# Patient Record
Sex: Male | Born: 1975
Health system: Southern US, Community
[De-identification: ages and names within clinical notes are randomized; demographics above are authoritative.]

## PROBLEM LIST (undated history)

## (undated) DIAGNOSIS — N189 Chronic kidney disease, unspecified: Secondary | ICD-10-CM

## (undated) DIAGNOSIS — K219 Gastro-esophageal reflux disease without esophagitis: Secondary | ICD-10-CM

## (undated) HISTORY — PX: CATARACT EXTRACTION W/ INTRAOCULAR LENS IMPLANT: SHX1309

## (undated) HISTORY — DX: Chronic kidney disease, unspecified: N18.9

## (undated) HISTORY — DX: Gastro-esophageal reflux disease without esophagitis: K21.9

---

## 2005-04-26 ENCOUNTER — Ambulatory Visit: Payer: Self-pay | Admitting: Internal Medicine

## 2006-04-10 ENCOUNTER — Ambulatory Visit: Payer: Self-pay | Admitting: Internal Medicine

## 2006-04-11 ENCOUNTER — Encounter: Payer: Self-pay | Admitting: Internal Medicine

## 2007-04-04 ENCOUNTER — Encounter: Payer: Self-pay | Admitting: Family Medicine

## 2007-04-05 ENCOUNTER — Ambulatory Visit: Payer: Self-pay | Admitting: Family Medicine

## 2007-04-05 DIAGNOSIS — K921 Melena: Secondary | ICD-10-CM | POA: Insufficient documentation

## 2007-04-06 LAB — CONVERTED CEMR LAB
Basophils Relative: 0.5 % (ref 0.0–1.0)
Eosinophils Relative: 1.2 % (ref 0.0–5.0)
HCT: 44.1 % (ref 39.0–52.0)
Hemoglobin: 15.2 g/dL (ref 13.0–17.0)
Lymphocytes Relative: 22.8 % (ref 12.0–46.0)
Monocytes Absolute: 0.5 10*3/uL (ref 0.2–0.7)
Neutrophils Relative %: 66.9 % (ref 43.0–77.0)
RDW: 12.4 % (ref 11.5–14.6)

## 2008-06-11 ENCOUNTER — Ambulatory Visit: Payer: Self-pay | Admitting: Internal Medicine

## 2008-06-11 DIAGNOSIS — J301 Allergic rhinitis due to pollen: Secondary | ICD-10-CM | POA: Insufficient documentation

## 2008-06-11 LAB — CONVERTED CEMR LAB
Cholesterol: 179 mg/dL (ref 0–200)
Glucose, Bld: 83 mg/dL (ref 70–99)
HDL: 56.3 mg/dL (ref >39.0)
LDL Cholesterol: 116 mg/dL — ABNORMAL HIGH (ref 0–99)
Total CHOL/HDL Ratio: 3.2
Triglycerides: 32 mg/dL (ref 0–149)
VLDL: 6 mg/dL (ref 0–40)

## 2009-01-22 ENCOUNTER — Ambulatory Visit: Payer: Self-pay | Admitting: Internal Medicine

## 2009-01-22 DIAGNOSIS — R109 Unspecified abdominal pain: Secondary | ICD-10-CM | POA: Insufficient documentation

## 2009-01-27 LAB — CONVERTED CEMR LAB
ALT: 28 units/L (ref 0–53)
Amylase: 210 units/L — ABNORMAL HIGH (ref 27–131)
BUN: 16 mg/dL (ref 6–23)
Bilirubin, Direct: 0.1 mg/dL (ref 0.0–0.3)
CO2: 27 meq/L (ref 19–32)
Creatinine, Ser: 1.1 mg/dL (ref 0.4–1.5)
Eosinophils Relative: 1 % (ref 0.0–5.0)
Glucose, Bld: 87 mg/dL (ref 70–99)
MCV: 88.7 fL (ref 78.0–100.0)
Monocytes Absolute: 0.4 10*3/uL (ref 0.1–1.0)
Neutrophils Relative %: 60.9 % (ref 43.0–77.0)
Phosphorus: 3.8 mg/dL (ref 2.3–4.6)
Platelets: 169 10*3/uL (ref 150.0–400.0)
Total Bilirubin: 1 mg/dL (ref 0.3–1.2)
WBC: 5.9 10*3/uL (ref 4.5–10.5)

## 2009-08-24 ENCOUNTER — Telehealth: Payer: Self-pay | Admitting: Internal Medicine

## 2009-08-28 ENCOUNTER — Ambulatory Visit: Payer: Self-pay | Admitting: Internal Medicine

## 2009-08-28 DIAGNOSIS — K625 Hemorrhage of anus and rectum: Secondary | ICD-10-CM | POA: Insufficient documentation

## 2009-09-02 LAB — CONVERTED CEMR LAB
Eosinophils Relative: 0.9 % (ref 0.0–5.0)
HCT: 44.7 % (ref 39.0–52.0)
Hemoglobin: 15 g/dL (ref 13.0–17.0)
Lymphs Abs: 1.6 10*3/uL (ref 0.7–4.0)
Monocytes Relative: 6.7 % (ref 3.0–12.0)
Neutro Abs: 3.4 10*3/uL (ref 1.4–7.7)
RDW: 12.8 % (ref 11.5–14.6)
WBC: 5.6 10*3/uL (ref 4.5–10.5)

## 2009-09-07 ENCOUNTER — Telehealth: Payer: Self-pay | Admitting: Family Medicine

## 2009-09-08 ENCOUNTER — Encounter (INDEPENDENT_AMBULATORY_CARE_PROVIDER_SITE_OTHER): Payer: Self-pay | Admitting: *Deleted

## 2009-09-10 ENCOUNTER — Telehealth: Payer: Self-pay | Admitting: Internal Medicine

## 2009-10-05 ENCOUNTER — Ambulatory Visit: Payer: Self-pay | Admitting: Internal Medicine

## 2009-10-05 ENCOUNTER — Encounter (INDEPENDENT_AMBULATORY_CARE_PROVIDER_SITE_OTHER): Payer: Self-pay | Admitting: *Deleted

## 2009-10-05 DIAGNOSIS — R198 Other specified symptoms and signs involving the digestive system and abdomen: Secondary | ICD-10-CM | POA: Insufficient documentation

## 2010-07-27 NOTE — Progress Notes (Signed)
Summary: bloody stools, stomach pain/ alc   Phone Note Call from Patient Call back at [phone]   Caller: Spouse Call For: Dr. Alphonsus Sias  Action Taken: Phone Call Completed Summary of Call: Wife would like phone call from you ragarding husband having bloody stools. She says that he was seen once for this before and is still having the same problems. She says that it is painful when he uses the bathroom and he also has stomach pains some times. I scheduled him an app to come in of friday. They use [facility] [address] if needed.  Initial call taken by: Melody Comas,  August 24, 2009 9:30 AM  Follow-up for Phone Call        please add him on today Follow-up by: Cindee Salt MD,  August 24, 2009 10:34 AM  Additional Follow-up for Phone Call Additional follow up Details #1::        cell number listed is incorrect, left message on machine at home for patient to return call.  DeShannon Smith CMA Duncan Dull)  August 24, 2009 10:38 AM   Spoke with pt's wife, she says pt has not had bloody stools since october- except for once  yesterday and he only has pain about once a week, and then it's very mild. Her main concern is whether he could have hemorrhoids again.  I told her he would need exam to know for sure.  She just wants to keep his friday appt. (cell phone number corrected) Additional Follow-up by: Lowella Petties CMA,  August 24, 2009 10:52 AM    Additional Follow-up for Phone Call Additional follow up Details #2::    okay as long as no sig pain and no active bleeding Follow-up by: Cindee Salt MD,  August 24, 2009 11:57 AM

## 2010-07-27 NOTE — Progress Notes (Signed)
Summary: wants to go to [facility]  Phone Note Call from Patient Call back at [facility] Phone [phone] Call back at [phone]   Caller: Spouse-  Jillian Summary of Call: Pt was referred to [facility] but wife wants to change that to [place]. Wants to see a male doctor. Needs to be a monday or an early morning appt (8-8:30).  Wife will cancel appt at [facility].   Thanks. Initial call taken by: Lowella Petties CMA,  September 10, 2009 2:39 PM  Follow-up for Phone Call        Spoke to the wife, for now they will keep the appt with [place]. Follow-up by: Carlton Adam,  September 10, 2009 3:51 PM

## 2010-07-27 NOTE — Progress Notes (Signed)
Summary: still with bloody stools  Phone Note Call from Patient Call back at [facility] Phone [phone] Call back at [phone]   Caller: Spouse- Jillian Summary of Call: Advised pt's wife of lab results.  She says every time the pt has had a BM over the last week it has been bloody- some on end of stool and on toilet paper.  Not in the toilet.  Please advise. Initial call taken by: Lowella Petties CMA,  September 07, 2009 2:23 PM  Follow-up for Phone Call        we should set up evaluation by GI to be sure he doesn't have colitis or proctitis Follow-up by: Cindee Salt MD,  September 07, 2009 2:56 PM  Additional Follow-up for Phone Call Additional follow up Details #1::        Appt made with Dr Leone Payor on 10/06/2009 at 9:45am. Additional Follow-up by: Carlton Adam,  September 08, 2009 5:09 PM

## 2010-07-27 NOTE — Assessment & Plan Note (Signed)
Summary: blood iin stools.Marland Kitchenem   History of Present Illness Visit Type: consult  Primary GI MD: Stan Head MD [facility] Primary Provider: Tillman Abide, MD Requesting Provider: Tillman Abide, MD Chief Complaint: rectal bleeding and rectal pain  History of Present Illness:   35 yo [place] man.He has had rectal bleeding off and on for two years. Initially on the toilet paper. Bowels irregular with some narrow stools at times. He is better since using  Much les bleeding - non since then. Last time3 weeks ago after "heavy" fod and some seen on toilet paper.   GI Review of Systems    Reports abdominal pain and  bloating.     Location of  Abdominal pain: generalized.    Denies acid reflux, belching, chest pain, dysphagia with liquids, dysphagia with solids, heartburn, loss of appetite, nausea, vomiting, vomiting blood, weight loss, and  weight gain.      Reports rectal bleeding and  rectal pain.     Denies anal fissure, black tarry stools, change in bowel habit, constipation, diarrhea, diverticulosis, fecal incontinence, heme positive stool, hemorrhoids, irritable bowel syndrome, jaundice, light color stool, and  liver problems.    Current Medications (verified): 1)  None  Allergies (verified): No Known Drug Allergies  Past History:  Past Medical History: Reviewed history from 06/11/2008 and no changes required. Allergic rhinitis  Past Surgical History: Reviewed history from 06/11/2008 and no changes required. Denies surgical history  Family History: Reviewed history from 08/28/2009 and no changes required. Parents in good health Dad has high cholesterol 1 brother  Maternal GF died ESRD No CAD, HTN, DM No prostate or colon cancer No Crohn's or colitis  Social History: Marital Status: Married 1 daughter Occupation: Tax adviser) - [employer] Former Smoker Alcohol use-yes, occasional Drug use-no Regular exercise-not really Daily Caffeine Use: 3 daily   Review of  Systems       some anxiety All other ROS negative except as per HPI.   Vital Signs:  Patient profile:   35 year old male Height:      67.5 inches Weight:      158 pounds BMI:     24.47 BSA:     1.84 Pulse rate:   64 / minute Pulse rhythm:   regular BP sitting:   128 / 76  (left arm) Cuff size:   regular  Vitals Entered By: Ok Anis CMA (October 05, 2009 10:16 AM)  Physical Exam  General:  Well developed, well nourished, no acute distress. Eyes:  PERRLA, no icterus. Mouth:  No deformity or lesions, dentition with multiple fillins. Neck:  Supple; no masses or thyromegaly. Lungs:  Clear throughout to auscultation. Heart:  Regular rate and rhythm; no murmurs, rubs,  or bruits. Abdomen:  Soft, nontender and nondistended. No masses, hepatosplenomegaly or hernias noted. Normal bowel sounds. Rectal:  deferred until time of colonoscopy.  Dr. Karle Starch 3/4 exam reviewed Extremities:  No clubbing, cyanosis, edema or deformities noted. Skin:  tattoos, upper back Cervical Nodes:  No significant cervical or supraclavicular adenopathy.    Impression & Recommendations:  Problem # 1:  RECTAL BLEEDING (ICD-569.3)  NEW to GI: Seems likely ano-rectal but with some bowel habit changes would evaluate for colo-rectal lesions. Risks, benefits,and indications of endoscopic procedure(s) were reviewed with the patient and all questions answered. He wishes to wait til he goes to [place] in May and do it after that  Orders: Colonoscopy (Colon)  Problem # 2:  CHANGE IN BOWELS (BJY-782.95) Assessment: New  better on  MiraLax plan to evaluate with colonoscopy iven the bleeding.  Orders: Colonoscopy (Colon)  Patient Instructions: 1)  Please pick up your medications at your pharmacy. MOVIPREP 2)  We will see you at your procedure on 12/01/09. 3)  [facility] Patient Information Guide given to patient.  4)  Colonoscopy and Flexible Sigmoidoscopy brochure given.  5)  Copy sent to :  Tillman Abide, MD 6)  The medication list was reviewed and reconciled.  All changed / newly prescribed medications were explained.  A complete medication list was provided to the patient / caregiver. Prescriptions: MOVIPREP 100 GM  SOLR (PEG-KCL-NACL-NASULF-NA ASC-C) As per prep instructions.  #1 x 0   Entered by:   Francee Piccolo CMA (AAMA)   Authorized by:   Iva Boop MD, [facility]   Signed by:   Francee Piccolo CMA (AAMA) on 10/05/2009   Method used:   Print then Give to Patient   RxID:   [id number]

## 2010-07-27 NOTE — Letter (Signed)
Summary: [facility] Instructions  [facility]  [address] [place], [place] [address]   Phone: [phone]  Fax: [phone]       Thomas Villarreal    Nov 10, 1975    MRN: [id number]      Procedure Day Dorna Bloom: [name], 12/01/09     Arrival Time: 7:30 AM      Procedure Time: 8:30 AM    Location of Procedure:                    _X_   Endoscopy Center (4th Floor)                     PREPARATION FOR COLONOSCOPY WITH MOVIPREP   Starting 5 days prior to your procedure 11/26/09 do not eat nuts, seeds, popcorn, corn, beans, peas,  salads, or any raw vegetables.  Do not take any fiber supplements (e.g. Metamucil, Citrucel, and Benefiber).  THE DAY BEFORE YOUR PROCEDURE         MONDAY, 11/30/09  1.  Drink clear liquids the entire day-NO SOLID FOOD  2.  Do not drink anything colored red or purple.  Avoid juices with pulp.  No orange juice.  3.  Drink at least 64 oz. (8 glasses) of fluid/clear liquids during the day to prevent dehydration and help the prep work efficiently.  CLEAR LIQUIDS INCLUDE: Water Jello Ice Popsicles Tea (sugar ok, no milk/cream) Powdered fruit flavored drinks Coffee (sugar ok, no milk/cream) Gatorade Juice: apple, white grape, white cranberry  Lemonade Clear bullion, consomm, broth Carbonated beverages (any kind) Strained chicken noodle soup Hard Candy                             4.  In the morning, mix first dose of MoviPrep solution:    Empty 1 Pouch A and 1 Pouch B into the disposable container    Add lukewarm drinking water to the top line of the container. Mix to dissolve    Refrigerate (mixed solution should be used within 24 hrs)  5.  Begin drinking the prep at 5:00 p.m. The MoviPrep container is divided by 4 marks.   Every 15 minutes drink the solution down to the next mark (approximately 8 oz) until the full liter is complete.   6.  Follow completed prep with 16 oz of clear liquid of your choice (Nothing red or purple).   Continue to drink clear liquids until bedtime.    7.  Before going to bed, mix second dose of MoviPrep solution:    Empty 1 Pouch A and 1 Pouch B into the disposable container    Add lukewarm drinking water to the top line of the container. Mix to dissolve    Refrigerate  THE DAY OF YOUR PROCEDURE      TUESDAY, 12/01/09  Beginning at 3:30 a.m. (5 hours before procedure):         1. Every 15 minutes, drink the solution down to the next mark (approx 8 oz) until the full liter is complete.  2. Follow completed prep with 16 oz. of clear liquid of your choice.    3. You may drink clear liquids until 6:30 AM (2 HOURS BEFORE PROCEDURE).  MEDICATION INSTRUCTIONS  Unless otherwise instructed, you should take regular prescription medications with a small sip of water   as early as possible the morning of your procedure.       OTHER INSTRUCTIONS  You will need a responsible adult at least 36 years of age to accompany you and drive you home.   This person must remain in the waiting room during your procedure.  Wear loose fitting clothing that is easily removed.  Leave jewelry and other valuables at home.  However, you may wish to bring a book to read or  an iPod/MP3 player to listen to music as you wait for your procedure to start.  Remove all body piercing jewelry and leave at home.  Total time from sign-in until discharge is approximately 2-3 hours.  You should go home directly after your procedure and rest.  You can resume normal activities the  day after your procedure.  The day of your procedure you should not:   Drive   Make legal decisions   Operate machinery   Drink alcohol   Return to work  You will receive specific instructions about eating, activities and medications before you leave.  The above instructions have been reviewed and explained to me by   [facility], CMA   I fully understand and can verbalize these instructions _____________________________  Date _________

## 2010-07-27 NOTE — Letter (Signed)
Summary: New Patient letter  [facility]  [address] [place], [place] [address]   Phone: [phone]  Fax: [phone]       09/08/2009 MRN: [id number]  [facility] [address] [place], [place]  [address]  Dear Mr. KLOSTER,  Welcome to the Gastroenterology Division at [facility].    You are scheduled to see Dr.  Stan Head on October 05, 2009 at 10:00am on the 3rd floor at [facility], [address]  We ask that you try to arrive at our office 15 minutes prior to your appointment time to allow for check-in.  We would like you to complete the enclosed self-administered evaluation form prior to your visit and bring it with you on the day of your appointment.  We will review it with you.  Also, please bring a complete list of all your medications or, if you prefer, bring the medication bottles and we will list them.  Please bring your insurance card so that we may make a copy of it.  If your insurance requires a referral to see a specialist, please bring your referral form from your primary care physician.  Co-payments are due at the time of your visit and may be paid by cash, check or credit card.     Your office visit will consist of a consult with your physician (includes a physical exam), any laboratory testing he/she may order, scheduling of any necessary diagnostic testing (e.g. x-ray, ultrasound, CT-scan), and scheduling of a procedure (e.g. Endoscopy, Colonoscopy) if required.  Please allow enough time on your schedule to allow for any/all of these possibilities.    If you cannot keep your appointment, please call [phone] to cancel or reschedule prior to your appointment date.  This allows [place] the opportunity to schedule an appointment for another patient in need of care.  If you do not cancel or reschedule by 5 p.m. the business day prior to your appointment date, you will be charged a $50.00 late cancellation/no-show fee.    Thank you for  choosing [facility] for your medical needs.  We appreciate the opportunity to care for you.  Please visit [place] at our website  to learn more about our practice.                     Sincerely,                                                             The Gastroenterology Division

## 2010-07-27 NOTE — Assessment & Plan Note (Signed)
Summary: BLOOD STOOLS   Vital Signs:  Patient profile:   35 year old male Weight:      161 pounds Temp:     98 degrees F oral BP sitting:   102 / 68  (left arm) Cuff size:   large  Vitals Entered By: Mervin Hack CMA Duncan Dull) (August 28, 2009 8:20 AM) CC: blood in stool   History of Present Illness: Having stomach pain One episode of  pain when ready to move bowels--down in lower abdomen  Has occ blood on toiilet paper and in bowl Very intermittent  Doesn't feel that his bowels are moving regularly small stools intermittently  May go as long as 2-3 days has to push hard at times--may be related to bleeding episodes  trying a healthy diet with brown rice and vegetables No help in bowels trying to lose weight  Still has occ stomach pain like before No meds for this  Has dry throat at night and in AM occ bad breath ??PND no history of sig allergies in past  Allergies: No Known Drug Allergies  Past History:  Past medical, surgical, family and social histories (including risk factors) reviewed for relevance to current acute and chronic problems.  Past Medical History: Reviewed history from 06/11/2008 and no changes required. Allergic rhinitis  Past Surgical History: Reviewed history from 06/11/2008 and no changes required. Denies surgical history  Family History: Parents in good health Dad has high cholesterol 1 brother  Maternal GF died ESRD No CAD, HTN, DM No prostate or colon cancer No Crohn's or colitis  Social History: Reviewed history from 06/11/2008 and no changes required. Former Smoker Alcohol use-yes, occasional Drug use-no Regular exercise-not really Marital Status: Married 1 daughter Occupation: Therapist, sports - [employer]  Review of Systems       No fevers weight is stable  Physical Exam  General:  alert and normal appearance.   Mouth:  no erythema and no exudates.   Abdomen:  soft, non-tender, no masses, no hepatomegaly, and no  splenomegaly.   Rectal:  no hemorrhoids, no masses, no tenderness, no fissures, no fistulae, and no perianal rash.   Stool heme negative Prostate:  no gland enlargement and no nodules.     Impression & Recommendations:  Problem # 1:  RECTAL BLEEDING (ICD-569.3) Assessment New  seems to be local source though no hemorrhoids or perirectal lesions the pain, esp with moving bowels are concerning still has poor eating habits Bowels are irregular and may be adding to this  possiblity of colitis exists though exam is benign now  P: wiil check labs    start miralax to get bowels regular      Orders: Venipuncture ([address]) TLB-CBC Platelet - w/Differential (85025-CBCD) TLB-CRP-High Sensitivity (C-Reactive Protein) (86140-FCRP) TLB-Sedimentation Rate (ESR) (85652-ESR)  Patient Instructions: 1)  Please start miralax-- 1 capful daily or every other day to help your bowels become more regular 2)  Please schedule a follow-up appointment as needed .   Prior Medications: Current Allergies (reviewed today): No known allergies

## 2011-02-09 ENCOUNTER — Encounter: Payer: Self-pay | Admitting: Family Medicine

## 2011-02-10 ENCOUNTER — Encounter: Payer: Self-pay | Admitting: Family Medicine

## 2011-02-10 ENCOUNTER — Ambulatory Visit (INDEPENDENT_AMBULATORY_CARE_PROVIDER_SITE_OTHER): Payer: BC Managed Care – PPO | Admitting: Family Medicine

## 2011-02-10 VITALS — BP 110/60 | HR 60 | Temp 98.3°F | Wt 164.8 lb

## 2011-02-10 DIAGNOSIS — B078 Other viral warts: Secondary | ICD-10-CM | POA: Insufficient documentation

## 2011-02-10 DIAGNOSIS — B079 Viral wart, unspecified: Secondary | ICD-10-CM | POA: Insufficient documentation

## 2011-02-10 MED ORDER — SALICYLIC ACID 17 % EX GEL: CUTANEOUS | Status: DC

## 2011-02-10 NOTE — Assessment & Plan Note (Signed)
Close to naibed so try salicylic acid first. See pt instructions. If not improved, return for further eval, consider LN2.

## 2011-02-10 NOTE — Patient Instructions (Signed)
Use salicylic acid on wart (Duoplant, Compound W, Duofilm). Pumice stone to file away dead skin, soak thumb in warm water for 5 min, then apply salicylic acid topically.  Cover area with duct tape and keep covered for 3-4 days. If pain/irritation, take break for a few days. If not resolving after 1 month, return to be seen.  Warts (Verrucae Vulgaris) Warts are a common viral infection. They are most commonly caused by the human papillomavirus (HPV). Warts can occur at all ages. However, they occur most frequently in older children and infrequently in the elderly. Warts may be single or multiple. Location and size varies. Warts can be spread by scratching the wart and then scratching normal skin. The life cycle of warts varies. However, most will disappear over many months to a couple years. Warts commonly do not cause problems (asymptomatic) unless they are over an area of pressure, such as the bottom of the foot. If they are large enough, they may cause pain with walking. DIAGNOSIS Warts are most commonly diagnosed by their appearance. Tissue samples (biopsies) are not required unless the wart looks abnormal. Most warts have a rough surface, are round, oval, or irregular, and are skin-colored to light yellow, brown, or gray. They are generally less than  in. (1.3 cm), but they can be any size. TREATMENT  Observation or no treatment.  Freezing with liquid nitrogen.   High heat (cautery).   Boosting the body's immunity to fight off the wart (immunotherapy using Candida antigen).  Laser surgery.   Application of various irritants and solutions.   HOME CARE INSTRUCTIONS Follow your caregiver's instructions. No special precautions are necessary. Often, treatment may be followed by a return (recurrence) of warts. Warts are generally difficult to treat and get rid of. If treatment is done in a clinic setting, usually more than 1 treatment is required. This is usually done on only a monthly basis  until the wart is completely gone. Document Released: 03/23/2005 Document Re-Released: 12/01/2009 [facility] Patient Information 2011 [place], [place].

## 2011-02-10 NOTE — Progress Notes (Signed)
  Subjective:    Patient ID: Thomas Villarreal, male    DOB: 03/31/1976, 35 y.o.   MRN: [id number]  HPI CC: R lesion on thumb  67mo h/o ?wart R thumb.  No pain or draining but works in [employer].  Would like taken care of.  Seems to be enlarging.  Has not tried anything for this yet.  No h/o warts in past.  Review of Systems Per HPI    Objective:   Physical Exam  AFVSS R thumb medial to nailbed with exophytic growth, about 5mm in diameter, nontender, nonpruritic, dry      Assessment & Plan:

## 2011-02-14 ENCOUNTER — Telehealth: Payer: Self-pay | Admitting: *Deleted

## 2011-02-14 ENCOUNTER — Ambulatory Visit: Payer: BC Managed Care – PPO | Admitting: Family Medicine

## 2011-02-15 ENCOUNTER — Ambulatory Visit (INDEPENDENT_AMBULATORY_CARE_PROVIDER_SITE_OTHER): Payer: BC Managed Care – PPO | Admitting: Family Medicine

## 2011-02-15 ENCOUNTER — Encounter: Payer: Self-pay | Admitting: Family Medicine

## 2011-02-15 VITALS — BP 102/60 | HR 52 | Temp 98.0°F | Wt 163.8 lb

## 2011-02-15 DIAGNOSIS — B079 Viral wart, unspecified: Secondary | ICD-10-CM

## 2011-02-15 DIAGNOSIS — B078 Other viral warts: Secondary | ICD-10-CM

## 2011-02-15 NOTE — Progress Notes (Signed)
  Subjective:    Patient ID: Thomas Villarreal, male    DOB: 06-15-1976, 35 y.o.   MRN: [id number]  HPI  47mo h/o ?wart R thumb. Had been there for at least two months, growing in size. Saw Dr. Reece Agar on 8/16.  Due to close proximity nail bed, tried topical salicytic acid first. Wart "fell off" and is now an open draining wound.  He is concerned that it is no longer a wart. Not painful.  Drainage was clear.   No fevers, chills or surrounding erythema.  O BP 102/60  Pulse 52  Temp(Src) 98 F (36.7 C) (Oral)  Wt 163 lb 12 oz (74.277 kg)   R thumb medial to nailbed open superficial wound about 5mm in diameter, nontender, nonpruritic, not actively draining, no surrounding erythema.  A: ?Viral wart, resolved, now with open wound.  Does not appear infected at this point.  P: Apply topical antibiotic ointment (samples given) daily. 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 If it is not improving or showing signs of infection, pt to call us immediately. The patient indicates understanding of these issues and agrees with the plan.

## 2011-03-04 NOTE — Telephone Encounter (Signed)
Opened in error

## 2011-08-26 ENCOUNTER — Ambulatory Visit: Payer: BC Managed Care – PPO | Admitting: Internal Medicine

## 2011-10-21 ENCOUNTER — Encounter: Payer: Self-pay | Admitting: Internal Medicine

## 2011-10-21 ENCOUNTER — Ambulatory Visit (INDEPENDENT_AMBULATORY_CARE_PROVIDER_SITE_OTHER): Payer: BC Managed Care – PPO | Admitting: Internal Medicine

## 2011-10-21 VITALS — BP 104/66 | HR 68 | Temp 98.5°F | Ht 66.0 in | Wt 164.5 lb

## 2011-10-21 DIAGNOSIS — Z0000 Encounter for general adult medical examination without abnormal findings: Secondary | ICD-10-CM

## 2011-10-21 DIAGNOSIS — R109 Unspecified abdominal pain: Secondary | ICD-10-CM

## 2011-10-21 DIAGNOSIS — Z23 Encounter for immunization: Secondary | ICD-10-CM

## 2011-10-21 DIAGNOSIS — J309 Allergic rhinitis, unspecified: Secondary | ICD-10-CM

## 2011-10-21 LAB — TSH: TSH: 2.22 u[IU]/mL (ref 0.35–5.50)

## 2011-10-21 LAB — BASIC METABOLIC PANEL
Calcium: 9.4 mg/dL (ref 8.4–10.5)
Creatinine, Ser: 1.1 mg/dL (ref 0.4–1.5)

## 2011-10-21 LAB — CBC WITH DIFFERENTIAL/PLATELET
Basophils Relative: 1 % (ref 0.0–3.0)
Eosinophils Absolute: 0 10*3/uL (ref 0.0–0.7)
Eosinophils Relative: 0.1 % (ref 0.0–5.0)
Lymphocytes Relative: 20.1 % (ref 12.0–46.0)
MCHC: 33.8 g/dL (ref 30.0–36.0)
Neutrophils Relative %: 72 % (ref 43.0–77.0)
RBC: 4.96 Mil/uL (ref 4.22–5.81)
WBC: 6.2 10*3/uL (ref 4.5–10.5)

## 2011-10-21 LAB — LIPID PANEL
HDL: 71.6 mg/dL (ref >39.00)
LDL Cholesterol: 105 mg/dL — ABNORMAL HIGH (ref 0–99)
Total CHOL/HDL Ratio: 3
Triglycerides: 30 mg/dL (ref 0.0–149.0)
VLDL: 6 mg/dL (ref 0.0–40.0)

## 2011-10-21 LAB — HEPATIC FUNCTION PANEL
ALT: 68 U/L — ABNORMAL HIGH (ref 0–53)
Albumin: 4.1 g/dL (ref 3.5–5.2)
Bilirubin, Direct: 0 mg/dL (ref 0.0–0.3)
Total Protein: 7.1 g/dL (ref 6.0–8.3)

## 2011-10-21 NOTE — Progress Notes (Signed)
Addended by: Josph Macho A on: 10/21/2011 12:48 PM   Modules accepted: Orders

## 2011-10-21 NOTE — Assessment & Plan Note (Signed)
Healthy Working out regularly Will update Td (with Tdap)

## 2011-10-21 NOTE — Assessment & Plan Note (Signed)
Discussed OTC Rx 

## 2011-10-21 NOTE — Assessment & Plan Note (Signed)
This is better No current symptoms to consider IBD Never had scopes done---no need to reconsider at this point Will recheck labs

## 2011-10-21 NOTE — Patient Instructions (Signed)
You can try cetirizine 10mg  daily or loratadine 10mg  1-2 daily as needed for allergy symptoms

## 2011-10-21 NOTE — Progress Notes (Signed)
Subjective:    Patient ID: Thomas Villarreal, male    DOB: March 15, 1976, 36 y.o.   MRN: [id number]  HPI Here for physical  No stomach problems No rectal bleeding Has been working out with trainer for some months---feels better Concerned about instructions to eat eggs before workouts and protein shakes after--worries about his cholesterol  Business is good Wife now able to help since child in school  Has treated wart on right thumb at home Now just has pink area there--?neovascularization  Concerned about thyroid Daughter had abnormal newborn testing--came back positive Mild abnormality persisted but no Rx needed  Current Outpatient Prescriptions on File Prior to Visit  Medication Sig Dispense Refill  . salicylic acid 17 % gel use as directed  15 g  0    No Known Allergies  Past Medical History  Diagnosis Date  . Allergic rhinitis     No past surgical history on file.  Family History  Problem Relation Age of Onset  . Hyperlipidemia Father   . Healthy Mother   . Kidney disease Maternal Grandfather     History   Social History  . Marital Status: Married    Spouse Name: N/A    Number of Children: 1  . Years of Education: N/A   Occupational History  . Pizza Maker/Manager Other    [employer]   Social History Main Topics  . Smoking status: Former Games developer  . Smokeless tobacco: Not on file  . Alcohol Use: Yes     Occasional  . Drug Use: No  . Sexually Active: Not on file   Other Topics Concern  . Not on file   Social History Narrative   Married; 1 daughterPizza maker/manager-Ciao'sNo regular exerciseCaffeine: 3/day   Review of Systems  Constitutional: Negative for fatigue and unexpected weight change.       Wears seat belt  HENT: Positive for congestion and rhinorrhea. Negative for hearing loss, dental problem and tinnitus.        Did see ENT for some allergy symptoms--hasn't used meds though (they recommended testing) Regular with dentist Diagnosed with  deviated septum also  Eyes: Negative for visual disturbance.       No unilateral vision loss or diplopia  Respiratory: Negative for cough, chest tightness and shortness of breath.   Cardiovascular: Negative for chest pain, palpitations and leg swelling.  Gastrointestinal: Negative for nausea, vomiting, abdominal pain, constipation and blood in stool.  Genitourinary: Negative for dysuria, frequency and difficulty urinating.       No sexual problems  Musculoskeletal: Negative for back pain, joint swelling and arthralgias.  Skin: Negative for pallor and rash.       Gets dry skin  Neurological: Negative for dizziness, syncope, weakness, light-headedness, numbness and headaches.  Hematological: Negative for adenopathy. Does not bruise/bleed easily.  Psychiatric/Behavioral: Negative for sleep disturbance and dysphoric mood. The patient is not nervous/anxious.        Objective:   Physical Exam  Constitutional: He is oriented to person, place, and time. He appears well-developed and well-nourished. No distress.  HENT:  Head: Normocephalic and atraumatic.  Right Ear: External ear normal.  Left Ear: External ear normal.  Mouth/Throat: Oropharynx is clear and moist. No oropharyngeal exudate.  Eyes: Conjunctivae and EOM are normal. Pupils are equal, round, and reactive to light.  Neck: Normal range of motion. Neck supple. No thyromegaly present.  Cardiovascular: Normal rate, regular rhythm, normal heart sounds and intact distal pulses.  Exam reveals no gallop.   No murmur heard.  Pulmonary/Chest: Effort normal and breath sounds normal. No respiratory distress. He has no wheezes. He has no rales.  Abdominal: Soft. There is no tenderness.  Musculoskeletal: Normal range of motion. He exhibits no edema and no tenderness.  Lymphadenopathy:    He has no cervical adenopathy.  Neurological: He is alert and oriented to person, place, and time.  Skin: No rash noted. No erythema.  Psychiatric: He has a  normal mood and affect. His behavior is normal. Thought content normal.          Assessment & Plan:

## 2011-10-27 ENCOUNTER — Encounter: Payer: Self-pay | Admitting: *Deleted

## 2012-05-25 ENCOUNTER — Encounter: Payer: Self-pay | Admitting: Family Medicine

## 2012-05-25 ENCOUNTER — Ambulatory Visit (INDEPENDENT_AMBULATORY_CARE_PROVIDER_SITE_OTHER): Payer: BC Managed Care – PPO | Admitting: Family Medicine

## 2012-05-25 VITALS — BP 118/80 | HR 66 | Temp 98.4°F | Ht 66.0 in | Wt 176.2 lb

## 2012-05-25 DIAGNOSIS — B078 Other viral warts: Secondary | ICD-10-CM

## 2012-05-25 DIAGNOSIS — R7989 Other specified abnormal findings of blood chemistry: Secondary | ICD-10-CM

## 2012-05-25 DIAGNOSIS — M545 Low back pain, unspecified: Secondary | ICD-10-CM

## 2012-05-25 DIAGNOSIS — B079 Viral wart, unspecified: Secondary | ICD-10-CM

## 2012-05-25 LAB — HEPATIC FUNCTION PANEL
Albumin: 4.2 g/dL (ref 3.5–5.2)
Alkaline Phosphatase: 53 U/L (ref 39–117)
Bilirubin, Direct: 0.1 mg/dL (ref 0.0–0.3)
Total Protein: 7.6 g/dL (ref 6.0–8.3)

## 2012-05-25 MED ORDER — CYCLOBENZAPRINE HCL 10 MG PO TABS: 10.0000 mg | ORAL_TABLET | Freq: Every evening | ORAL | Status: DC | PRN

## 2012-05-25 MED ORDER — DICLOFENAC SODIUM 75 MG PO TBEC: 75.0000 mg | DELAYED_RELEASE_TABLET | Freq: Two times a day (BID) | ORAL | Status: DC

## 2012-05-25 NOTE — Assessment & Plan Note (Signed)
Some evidence of radiculopathy, also muscle spasm. Treat with NSAIDs, muscle relaxant. Info given. Follow up if not improving in 2 weeks.

## 2012-05-25 NOTE — Progress Notes (Signed)
  Subjective:    Patient ID: Thomas Villarreal, male    DOB: 1976-03-02, 36 y.o.   MRN: [id number]  HPI 36 year old male presents with new onset low back pain. Onset in last 24 hours.  He did some deadweight lifting at the gym the day prior, no specific injury but had started back after not exercising. No fall.  He has applied heat, taking excederine. These have helped a lot but sleep is disturbed. Pain radiates to right buttock an right posterior upper thigh. No weakness, no numbness. No fever.  No history of back issues or past back surgery.   Has had wart on right thumb.Marland Kitchen Has applied salicylic acid. Helps some but always comes back.   Review of Systems  Constitutional: Negative for fatigue.  HENT: Negative for ear pain.   Eyes: Negative for pain.  Respiratory: Negative for cough and shortness of breath.   Cardiovascular: Negative for chest pain.  Skin: Negative for rash.       Objective:   Physical Exam  Constitutional: He is oriented to person, place, and time. He appears well-developed and well-nourished.  Neck: Normal range of motion. Neck supple. No thyromegaly present.  Cardiovascular: Normal rate and regular rhythm.   No murmur heard. Pulmonary/Chest: Effort normal and breath sounds normal.  Abdominal: Soft. Bowel sounds are normal. There is no tenderness.  Musculoskeletal:       ttp over B low back paraspinous muscles, right buttock ttp, positive SLR on right, neg Faber's.  Strength 5/5 B. Sensation intact.  Neurological: He is alert and oriented to person, place, and time. He has normal reflexes.  Skin: Skin is warm and dry. No rash noted.       Wart on right thumb lateral edge of nail bed.          Assessment & Plan:

## 2012-05-25 NOTE — Addendum Note (Signed)
Addended by: Baldomero Lamy on: 05/25/2012 09:07 AM   Modules accepted: Orders

## 2012-05-25 NOTE — Patient Instructions (Signed)
Start anti-inflammatory.  Use muscle relaxant at nght.  Heat, massage, gentle stretching.  Follow up if not better in 2 weeks.

## 2012-05-25 NOTE — Assessment & Plan Note (Signed)
Treated with cryotherapy...3 freeze thaw cycles with 3 mm radius of halo.

## 2012-05-28 LAB — HEPATITIS PANEL, ACUTE
HCV Ab: NEGATIVE
Hep A IgM: NEGATIVE
Hep B C IgM: NEGATIVE

## 2012-06-06 ENCOUNTER — Encounter: Payer: Self-pay | Admitting: *Deleted

## 2012-06-12 ENCOUNTER — Ambulatory Visit (INDEPENDENT_AMBULATORY_CARE_PROVIDER_SITE_OTHER): Payer: BC Managed Care – PPO | Admitting: Internal Medicine

## 2012-06-12 ENCOUNTER — Encounter: Payer: Self-pay | Admitting: Internal Medicine

## 2012-06-12 VITALS — BP 100/70 | HR 63 | Temp 97.7°F | Wt 174.0 lb

## 2012-06-12 DIAGNOSIS — R748 Abnormal levels of other serum enzymes: Secondary | ICD-10-CM

## 2012-06-12 LAB — HEPATIC FUNCTION PANEL
Albumin: 4.2 g/dL (ref 3.5–5.2)
Bilirubin, Direct: 0.1 mg/dL (ref 0.0–0.3)
Total Protein: 7.3 g/dL (ref 6.0–8.3)

## 2012-06-12 NOTE — Progress Notes (Signed)
  Subjective:    Patient ID: Thomas Villarreal, male    DOB: 1975-07-22, 36 y.o.   MRN: [id number]  HPI Here to discuss abnormal liver test First noted years ago-- ~2006 Viral antibodies were negative  No regular alcohol or acetaminophen (did take some excedrin last time for his back and will occ have 1 beer) Feels well Normal stools No nausea or vomiting  No current outpatient prescriptions on file prior to visit.    No Known Allergies  Past Medical History  Diagnosis Date  . Allergic rhinitis     No past surgical history on file.  Family History  Problem Relation Age of Onset  . Hyperlipidemia Father   . Healthy Mother   . Kidney disease Maternal Grandfather     History   Social History  . Marital Status: Married    Spouse Name: N/A    Number of Children: 1  . Years of Education: N/A   Occupational History  . Pizza Maker/Manager Other    [employer]   Social History Main Topics  . Smoking status: Former Games developer  . Smokeless tobacco: Never Used  . Alcohol Use: Yes     Comment: Occasional  . Drug Use: No  . Sexually Active: Not on file   Other Topics Concern  . Not on file   Social History Narrative   Married; 1 daughterPizza maker/manager-[employer]iao'sNo regular exerciseCaffeine: 3/day   Review of Systems Appetite is good Weight stable    Objective:   Physical Exam  Constitutional: He appears well-developed and well-nourished. No distress.  Neck: Normal range of motion. Neck supple. No thyromegaly present.  Cardiovascular: Normal rate, regular rhythm and normal heart sounds.  Exam reveals no gallop.   No murmur heard. Pulmonary/Chest: Effort normal and breath sounds normal. No respiratory distress. He has no wheezes. He has no rales.  Abdominal: Soft. Bowel sounds are normal. He exhibits no distension and no mass. There is no tenderness. There is no rebound and no guarding.       No hepatosplenomegaly  Musculoskeletal: He exhibits no edema and no tenderness.   Lymphadenopathy:    He has no cervical adenopathy.  Psychiatric:       A little nervous about this          Assessment & Plan:

## 2012-06-12 NOTE — Assessment & Plan Note (Signed)
Goes back for years and has normalized in the past then elevated again No sig tylenol or alcohol Hepatitis profile negative  Will recheck labs and do ultrasound If negative, just observation

## 2012-06-21 ENCOUNTER — Ambulatory Visit: Payer: Self-pay | Admitting: Internal Medicine

## 2012-06-21 ENCOUNTER — Encounter: Payer: Self-pay | Admitting: Internal Medicine

## 2012-06-22 ENCOUNTER — Encounter: Payer: Self-pay | Admitting: *Deleted

## 2012-08-11 ENCOUNTER — Other Ambulatory Visit: Payer: Self-pay

## 2013-05-02 ENCOUNTER — Other Ambulatory Visit: Payer: Self-pay

## 2013-09-16 ENCOUNTER — Encounter: Payer: Self-pay | Admitting: Internal Medicine

## 2013-09-16 ENCOUNTER — Ambulatory Visit (INDEPENDENT_AMBULATORY_CARE_PROVIDER_SITE_OTHER): Payer: BC Managed Care – PPO | Admitting: Internal Medicine

## 2013-09-16 ENCOUNTER — Ambulatory Visit (INDEPENDENT_AMBULATORY_CARE_PROVIDER_SITE_OTHER)
Admission: RE | Admit: 2013-09-16 | Discharge: 2013-09-16 | Disposition: A | Discharge status: Home / Self Care | Payer: BC Managed Care – PPO | Source: Ambulatory Visit | Attending: Internal Medicine | Admitting: Internal Medicine

## 2013-09-16 VITALS — BP 110/60 | HR 60 | Temp 98.0°F | Wt 179.0 lb

## 2013-09-16 DIAGNOSIS — R0602 Shortness of breath: Secondary | ICD-10-CM | POA: Insufficient documentation

## 2013-09-16 NOTE — Assessment & Plan Note (Addendum)
Vague but with clear orthopnea and 1 episode of PND last week Better now Able to play soccer (though he isn't really overly aggressive now) EKG is normal CXR looks fine  Discussed , this is most likely reflux related Discussed trying not to eat close to bedtime Also has nasal congestion---considering septal repair

## 2013-09-16 NOTE — Progress Notes (Signed)
Pre visit review using our clinic review tool, if applicable. No additional management support is needed unless otherwise documented below in the visit note. 

## 2013-09-16 NOTE — Progress Notes (Signed)
   Subjective:    Patient ID: Thomas Villarreal, male    DOB: 1976/03/10, 38 y.o.   MRN: [id number]  HPI Hard time breathing 3/18-20 Had orthopnea and then 1 episode of PND  Seems better now Able to lie down last night No cough or fever No ankle swelling No real chest pain or tightness Did have racing heart when he woke SOB  Did play soccer for an hour on 3/21--midfielder No DOE then  Did have a tight feeling in throat when he awoke No regular heartburn  No current outpatient prescriptions on file prior to visit.   No current facility-administered medications on file prior to visit.    No Known Allergies  Past Medical History  Diagnosis Date  . Allergic rhinitis     No past surgical history on file.  Family History  Problem Relation Age of Onset  . Hyperlipidemia Father   . Healthy Mother   . Kidney disease Maternal Grandfather     History   Social History  . Marital Status: Married    Spouse Name: N/A    Number of Children: 1  . Years of Education: N/A   Occupational History  . Pizza Maker/Manager Other    [employer]   Social History Main Topics  . Smoking status: Former Games developermoker  . Smokeless tobacco: Never Used  . Alcohol Use: Yes     Comment: Occasional  . Drug Use: No  . Sexual Activity: Not on file   Other Topics Concern  . Not on file   Social History Narrative   Married; 1 daughter      Brett Albinoizza maker/manager-[employer]      No regular exercise      Caffeine: 3/day         Review of Systems Weight is about the same or up slightly Hasn't been working out in the gym for the past 3 months due to shoulder problems Still some sinus congestion    Objective:   Physical Exam  Constitutional: He is oriented to person, place, and time. He appears well-developed and well-nourished. No distress.  Neck: Normal range of motion. Neck supple. No thyromegaly present.  Cardiovascular: Normal rate, regular rhythm, normal heart sounds and intact distal pulses.   Exam reveals no gallop.   No murmur heard. Pulmonary/Chest: Effort normal and breath sounds normal. No respiratory distress. He has no wheezes. He has no rales.  Abdominal: Soft. There is no tenderness.  Musculoskeletal: He exhibits no edema and no tenderness.  Lymphadenopathy:    He has no cervical adenopathy.  Neurological: He is alert and oriented to person, place, and time.  Skin: No rash noted. No erythema.  Psychiatric: He has a normal mood and affect. His behavior is normal.          Assessment & Plan:

## 2013-09-16 NOTE — Patient Instructions (Signed)
Please try not to eat close to bedtime. If you eat late, it would be a good idea to try omeprazole 20mg  or rantidine 150mg  at bedtime.

## 2014-01-10 ENCOUNTER — Emergency Department: Payer: Self-pay | Admitting: Emergency Medicine

## 2014-01-10 LAB — CBC
HCT: 45.6 % (ref 40.0–52.0)
HGB: 15 g/dL (ref 13.0–18.0)
MCH: 29.4 pg (ref 26.0–34.0)
MCHC: 33 g/dL (ref 32.0–36.0)
MCV: 89 fL (ref 80–100)
PLATELETS: 189 10*3/uL (ref 150–440)
RBC: 5.12 10*6/uL (ref 4.40–5.90)
RDW: 13.8 % (ref 11.5–14.5)
WBC: 7.4 10*3/uL (ref 3.8–10.6)

## 2014-01-10 LAB — BASIC METABOLIC PANEL
Anion Gap: 8 (ref 7–16)
BUN: 20 mg/dL — AB (ref 7–18)
CHLORIDE: 105 mmol/L (ref 98–107)
Calcium, Total: 8.7 mg/dL (ref 8.5–10.1)
Co2: 24 mmol/L (ref 21–32)
Creatinine: 1.2 mg/dL (ref 0.60–1.30)
EGFR (African American): >60
EGFR (Non-African Amer.): >60
Glucose: 114 mg/dL — ABNORMAL HIGH (ref 65–99)
OSMOLALITY: 277 (ref 275–301)
Potassium: 3.8 mmol/L (ref 3.5–5.1)
Sodium: 137 mmol/L (ref 136–145)

## 2014-01-10 LAB — TROPONIN I

## 2014-01-11 LAB — TROPONIN I: Troponin-I: <0.02 ng/mL

## 2014-01-15 ENCOUNTER — Ambulatory Visit: Payer: BC Managed Care – PPO | Admitting: Internal Medicine

## 2014-01-15 ENCOUNTER — Ambulatory Visit (INDEPENDENT_AMBULATORY_CARE_PROVIDER_SITE_OTHER): Payer: BC Managed Care – PPO | Admitting: Family Medicine

## 2014-01-15 ENCOUNTER — Ambulatory Visit: Payer: BC Managed Care – PPO | Admitting: Cardiology

## 2014-01-15 ENCOUNTER — Encounter: Payer: Self-pay | Admitting: Family Medicine

## 2014-01-15 VITALS — BP 130/84 | HR 70 | Temp 98.8°F | Ht 66.0 in | Wt 176.5 lb

## 2014-01-15 DIAGNOSIS — R0602 Shortness of breath: Secondary | ICD-10-CM

## 2014-01-15 DIAGNOSIS — R0789 Other chest pain: Secondary | ICD-10-CM

## 2014-01-15 MED ORDER — OMEPRAZOLE 20 MG PO CPDR: 20.0000 mg | DELAYED_RELEASE_CAPSULE | Freq: Every day | ORAL | Status: DC

## 2014-01-15 NOTE — Patient Instructions (Signed)
Take omeprazole for acid reflux 20 mg one pill daily in am -to see if this helps your chest and throat symptoms- try it for 2 weeks  We will see if we can get you in earlier to the cardiologist  In the meantime let us know if symptoms worsen  I will send for records from the ER

## 2014-01-15 NOTE — Progress Notes (Signed)
Subjective:    Patient ID: Thomas Villarreal, male    DOB: [birth year], 38 y.o.   MRN: [id number]  HPI Here for chest discomfort   Friday night - he felt kind of sweaty and chilled  On the way home from work - felt short of breath and chest discomfort  Went to ER -had cxr and EKG and labs for cardiac enzymes  Told everything was normal  Was adv to go to cardiologist - and could not be seen for 2 weeks - he was ok with that   Last night - in bed -sweating and his heart was racing also - up all night  Woke up with some nausea as well-went to work feeling terrible on and off  Then on the way here -has a pressure sensation over his lower neck and mid chest  Feels like he is short of breath   No hx of gerd  Does not have heartburn  He does sometimes have discomfort from epigatric area to mid chest -mild pain/ different -and not burning  He does burp a lot -more than usual lately -no diet change   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 Chest discomfort 01/15/2014  . SOB (shortness of breath) 09/16/2013  . Abnormal liver enzymes 06/12/2012  . Low back pain 05/25/2012  . Routine general medical examination at a health care facility 10/21/2011  . ALLERGIC RHINITIS 06/11/2008   Past Medical History  Diagnosis Date  . Allergic rhinitis    No past surgical history on file. History  Substance Use Topics  . Smoking status: Former Games developer  . Smokeless tobacco: Never Used  . Alcohol Use: Yes    Comment: Occasional   Family History  Problem Relation Age of Onset  . Hyperlipidemia Father   . Healthy Mother   . Kidney disease Maternal Grandfather    No Known Allergies No current outpatient prescriptions on file prior to visit.   No current facility-administered medications on file prior to visit.    Review of Systems Review of Systems  Constitutional: Negative for fever, appetite change,  and unexpected weight change.  Eyes: Negative for pain and visual disturbance.  Respiratory: Negative for cough and shortness of breath.   Cardiovascular: Negative for pedal edema or PND, pos for sweating/ chest discomfort, neg for exertional symptoms     Gastrointestinal: Negative for nausea, diarrhea and constipation.  Genitourinary: Negative for urgency and frequency.  Skin: Negative for pallor or rash   Neurological: Negative for weakness, light-headedness, and headaches. pos for tingling in hands and around mouth occasionally Hematological: Negative for adenopathy. Does not bruise/bleed easily.  Psychiatric/Behavioral: Negative for dysphoric mood. Pos for stressors and times when he feels anxious .         Objective:   Physical Exam  Constitutional: He appears well-developed and well-nourished. No distress.  HENT:  Head: Normocephalic and atraumatic.  Mouth/Throat: Oropharynx is clear and moist.  Eyes: Conjunctivae and EOM are normal. Pupils are equal, round, and reactive to light. No scleral icterus.  Neck: Normal range of motion. Neck supple. No JVD present. Carotid bruit is not present. No thyromegaly present.  Cardiovascular: Normal rate, regular rhythm, normal heart sounds and intact distal pulses.  Exam reveals no gallop.   Pulmonary/Chest: Effort normal and breath sounds normal. No respiratory distress. He has no wheezes. He has no rales. He exhibits no tenderness.  Abdominal: Soft. Bowel sounds are normal. He exhibits no distension and no mass. There is tenderness. There is  no rebound and no guarding.  Mild epigastric tenderness   Musculoskeletal: He exhibits no edema.  Lymphadenopathy:    He has no cervical adenopathy.  Neurological: He is alert. He has normal reflexes. No cranial nerve deficit. He exhibits normal muscle tone. Coordination normal.  Skin: Skin is warm and dry. No rash noted. No erythema. No pallor.  Psychiatric: His mood appears anxious.  Pleasant  Mildly anxious  Talks candidly about stressors           Assessment & Plan:   Problem List Items Addressed This Visit     Other   SOB (shortness of breath)     In conj with chest discomfort (atypical, usually when lying down) and pressure in neck and arms / numbness and palpitation (heart racing) Per pt -r/o in ER for MI  Nl ekg today  Pend cardiology visit- will see if we can make that sooner  Disc poss of anxiety/panic attacks as well     Chest discomfort     Intermittent/assoc with other symptoms and atypical (incl palpitations/ feeling of heart racing) and a feeling of a lump in his throat that happens when lying flat  Has been r/o for MI and pend cardiol appt soon  EKG NSR with rate of 67  Suspect not cardiac  We disc poss of panic disorder and also GERD  Trial of empiric omeprazole 20 mg -will update  Disc gerd diet and handout given as well  >25 minutes spent in face to face time with patient, >50% spent in counselling or coordination of care      Other Visit Diagnoses   Chest tightness or pressure    -  Primary    Relevant Orders       EKG 12-Lead (Completed)

## 2014-01-15 NOTE — Progress Notes (Signed)
Pre visit review using our clinic review tool, if applicable. No additional management support is needed unless otherwise documented below in the visit note. 

## 2014-01-16 NOTE — Assessment & Plan Note (Signed)
In conj with chest discomfort (atypical, usually when lying down) and pressure in neck and arms / numbness and palpitation (heart racing) Per pt -r/o in ER for MI  Nl ekg today  Pend cardiology visit- will see if we can make that sooner  Disc poss of anxiety/panic attacks as well

## 2014-01-16 NOTE — Assessment & Plan Note (Signed)
Intermittent/assoc with other symptoms and atypical (incl palpitations/ feeling of heart racing) and a feeling of a lump in his throat that happens when lying flat  Has been r/o for MI and pend cardiol appt soon  EKG NSR with rate of 67  Suspect not cardiac  We disc poss of panic disorder and also GERD  Trial of empiric omeprazole 20 mg -will update  Disc gerd diet and handout given as well  >25 minutes spent in face to face time with patient, >50% spent in counselling or coordination of care

## 2014-01-22 ENCOUNTER — Ambulatory Visit (INDEPENDENT_AMBULATORY_CARE_PROVIDER_SITE_OTHER): Payer: BC Managed Care – PPO | Admitting: Cardiovascular Disease

## 2014-01-22 ENCOUNTER — Encounter: Payer: Self-pay | Admitting: Cardiovascular Disease

## 2014-01-22 VITALS — BP 136/83 | HR 55 | Ht 67.0 in | Wt 176.2 lb

## 2014-01-22 VITALS — BP 112/80 | HR 57 | Ht 67.0 in | Wt 176.2 lb

## 2014-01-22 DIAGNOSIS — R531 Weakness: Secondary | ICD-10-CM

## 2014-01-22 DIAGNOSIS — R0602 Shortness of breath: Secondary | ICD-10-CM

## 2014-01-22 DIAGNOSIS — R5383 Other fatigue: Secondary | ICD-10-CM

## 2014-01-22 DIAGNOSIS — K219 Gastro-esophageal reflux disease without esophagitis: Secondary | ICD-10-CM

## 2014-01-22 DIAGNOSIS — R5381 Other malaise: Secondary | ICD-10-CM

## 2014-01-22 DIAGNOSIS — R0789 Other chest pain: Secondary | ICD-10-CM

## 2014-01-22 NOTE — Progress Notes (Signed)
Patient ID: Thomas Villarreal, male    DOB: 07-27-1975, 38 y.o.   MRN: [id number]  HPI Comments: Mr. Thomas Villarreal is a very pleasant 38 year old gentleman who presents with symptoms of shortness of breath, chest pain.  Reports having symptoms several months ago waking up in the middle of the night with shortness of breath. Workup to primary care that time was normal. He is doing well until recently. He went on a vacation for 5 days with his wife. He ate very rich foods including raw oysters etc. He got back into town on Wednesday.  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 Some nausea and pressure in his upper epigastric area up into his chest. He had symptoms driving home also with shortness of breath. He went to the emergency room  Emergency room records were reviewed. This showed normal EKG, normal cardiac enzymes, normal chest x-ray, all other lab work essentially normal. BUN borderline elevated at 20 Is discharged home  He saw primary care and followup and was started on omeprazole 20 mg daily  Continues to have symptoms of shortness of breath, a fullness in his throat, possible epigastric issues Overall he does not feel like himself He does report having previous sinus or allergy issues but is uncertain if this is from allergies  Previously was working out on a regular basis. 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 omeprazole (PRILOSEC) 20 MG capsule Take 1 capsule (20 mg total) by mouth daily. In the am before breakfast    Review of Systems  Constitutional: Negative.   HENT:  Negative.   Eyes: Negative.   Respiratory: Positive for chest tightness and shortness of breath.   Cardiovascular: Positive for chest pain.       Sweating  Gastrointestinal: Negative.   Endocrine: Negative.   Musculoskeletal: Negative.   Skin: Negative.   Allergic/Immunologic: Negative.   Neurological: Negative.   Hematological: Negative.   Psychiatric/Behavioral: The patient is nervous/anxious.   All other systems reviewed and are negative.   BP 112/80  Pulse 57  Ht 5\' 7"  (1.702 m)  Wt 176 lb 4 oz (79.946 kg)  BMI 27.60 kg/m2   Physical Exam  Nursing note and vitals reviewed. Constitutional: He is oriented to person, place, and time. He appears well-developed and well-nourished.  HENT:  Head: Normocephalic.  Nose: Nose normal.  Mouth/Throat: Oropharynx is clear and moist.  Eyes: Conjunctivae are normal. Pupils are equal, round, and reactive to light.  Neck: Normal range of motion. Neck supple. No JVD present.  Cardiovascular: Normal rate, regular rhythm, S1 normal, S2 normal, normal heart sounds and intact distal pulses.  Exam reveals no gallop and no friction rub.   No murmur heard. Pulmonary/Chest: Effort normal and breath sounds normal. No respiratory distress. He has no wheezes. He has no rales. He exhibits no tenderness.  Abdominal: Soft. Bowel sounds are normal. He exhibits no distension. There is no tenderness.  Musculoskeletal: Normal range of motion. He exhibits no edema and no tenderness.  Lymphadenopathy:    He  has no cervical adenopathy.  Neurological: He is alert and oriented to person, place, and time. Coordination normal.  Skin: Skin is warm and dry. No rash noted. No erythema.  Psychiatric: He has a normal mood and affect. His behavior is normal. Judgment and thought content normal.      Assessment and Plan

## 2014-01-22 NOTE — Patient Instructions (Signed)
You are doing well. No medication changes were made.  We will order s treadmill study, for today  Please call us if you have new issues that need to be addressed before your next appt.

## 2014-01-22 NOTE — Assessment & Plan Note (Signed)
Etiology of his shortness of breath is unclear. Concerning for anxiety attacks. Denies any new stressors at work. Symptoms atypical in general. We will order a routine treadmill study to exclude ischemia as a cause of his symptoms. If treadmill study is normal and symptoms persist, could consider routine echocardiogram. EKG and clinical exam is essentially benign. Plan was discussed with him

## 2014-01-22 NOTE — Procedures (Signed)
Exercise Treadmill Test  Treadmill ordered for recent epsiodes of chest pain and shortness of breath.  Resting EKG shows NSR with rate of 70 bpm, no significant ST or T wave changes Resting blood pressure of 136/83. Stand bruce protocal was used.  Patient exercised for 12 min 00 sec,  Peak heart rate of 153 bpm.  This was 85% of the maximum predicted heart rate (target heart rate 153). Achieved 13.7 METS No symptoms of chest pain or lightheadedness were reported at peak stress or in recovery.  Peak Blood pressure recorded was 181/95. Heart rate at 3 minutes in recovery was 120 bpm. No ST changes concerning for arrhythmia.    FINAL IMPRESSION: Normal exercise stress test. No significant EKG changes concerning for ischemia. Excellent exercise tolerance.

## 2014-01-22 NOTE — Assessment & Plan Note (Addendum)
Atypical chest pain. Routine treadmill study ordered. No symptoms with exertion, in general has been doing well with activity. Symptoms coming on at rest, more like anxiety-type symptoms or panic attacks. He does not want any medications for anxiety. If treadmill is normal, recommended he start a regular walking program

## 2014-01-22 NOTE — Assessment & Plan Note (Signed)
He is avoiding alcohol, red sauces. He is on omeprazole. Recommended he stay on omeprazole for now at least until his medication prescription is complete. If no improvement on the omeprazole, could consider stopping the medication. He has indicated that he does not like to take medications

## 2014-01-22 NOTE — Patient Instructions (Signed)
Your stress test was normal, Excellent exercise tolerance, normal blood pressure with exercise No EKG changes concerning for heart blockage  If you continue to have worsening shortness of breath, particularly with exertion, please call he office Additional testing could be done such as echocardiogram

## 2014-01-24 ENCOUNTER — Ambulatory Visit: Payer: BC Managed Care – PPO | Admitting: Cardiovascular Disease

## 2014-02-04 ENCOUNTER — Ambulatory Visit (INDEPENDENT_AMBULATORY_CARE_PROVIDER_SITE_OTHER): Payer: BC Managed Care – PPO | Admitting: Internal Medicine

## 2014-02-04 ENCOUNTER — Encounter: Payer: Self-pay | Admitting: Internal Medicine

## 2014-02-04 VITALS — BP 118/70 | HR 62 | Temp 98.2°F | Wt 175.0 lb

## 2014-02-04 DIAGNOSIS — R1013 Epigastric pain: Secondary | ICD-10-CM

## 2014-02-04 DIAGNOSIS — K2970 Gastritis, unspecified, without bleeding: Secondary | ICD-10-CM | POA: Insufficient documentation

## 2014-02-04 LAB — CBC WITH DIFFERENTIAL/PLATELET
BASOS ABS: 0 10*3/uL (ref 0.0–0.1)
BASOS PCT: 0.7 % (ref 0.0–3.0)
EOS ABS: 0 10*3/uL (ref 0.0–0.7)
Eosinophils Relative: 0.7 % (ref 0.0–5.0)
HCT: 45.8 % (ref 39.0–52.0)
Hemoglobin: 15.3 g/dL (ref 13.0–17.0)
LYMPHS PCT: 25.7 % (ref 12.0–46.0)
Lymphs Abs: 1.7 10*3/uL (ref 0.7–4.0)
MCHC: 33.3 g/dL (ref 30.0–36.0)
MCV: 87.8 fl (ref 78.0–100.0)
MONO ABS: 0.5 10*3/uL (ref 0.1–1.0)
Monocytes Relative: 7.2 % (ref 3.0–12.0)
NEUTROS PCT: 65.7 % (ref 43.0–77.0)
Neutro Abs: 4.3 10*3/uL (ref 1.4–7.7)
PLATELETS: 205 10*3/uL (ref 150.0–400.0)
RBC: 5.22 Mil/uL (ref 4.22–5.81)
RDW: 13.6 % (ref 11.5–15.5)
WBC: 6.5 10*3/uL (ref 4.0–10.5)

## 2014-02-04 LAB — COMPREHENSIVE METABOLIC PANEL
ALBUMIN: 4.2 g/dL (ref 3.5–5.2)
ALT: 24 U/L (ref 0–53)
AST: 19 U/L (ref 0–37)
Alkaline Phosphatase: 47 U/L (ref 39–117)
BUN: 16 mg/dL (ref 6–23)
CHLORIDE: 104 meq/L (ref 96–112)
CO2: 25 mEq/L (ref 19–32)
Calcium: 9.7 mg/dL (ref 8.4–10.5)
Creatinine, Ser: 1.1 mg/dL (ref 0.4–1.5)
GFR: 75.85 mL/min (ref >60.00)
GLUCOSE: 86 mg/dL (ref 70–99)
Potassium: 3.9 mEq/L (ref 3.5–5.1)
Sodium: 139 mEq/L (ref 135–145)
TOTAL PROTEIN: 7.4 g/dL (ref 6.0–8.3)
Total Bilirubin: 0.7 mg/dL (ref 0.2–1.2)

## 2014-02-04 LAB — LIPASE: LIPASE: 25 U/L (ref 11.0–59.0)

## 2014-02-04 NOTE — Progress Notes (Signed)
Pre visit review using our clinic review tool, if applicable. No additional management support is needed unless otherwise documented below in the visit note. 

## 2014-02-04 NOTE — Assessment & Plan Note (Signed)
Not really chest pain His history is vague but not consistent with IBS Most sounds like reflux but no response to PPI ?gastritis ?gallstone attacks Doubt pancreatitis Doesn't sound like IBD---no weight loss, etc  Will check labs Check abdominal ultrasound Continue PPI

## 2014-02-04 NOTE — Progress Notes (Signed)
   Subjective:    Patient ID: Thomas Villarreal, male    DOB: 01/04/1976, 38 y.o.   MRN: [id number]  HPI Here with wife Thomas Villarreal  Having daily "chest pain" Wife notes that he is pointing at epigastrium Has it intermittently--more later in day and night May get associated numbness in left arm also  Recently slept through night okay--- but then awoke with pain (and was white) Will actually look sallow/yellow during spells Will have some dizziness also Some diaphoresis with the pain also  First noted in March Seemed to worsen a month ago after trip to [place]--- ate lots of raw oysters Soon after that was having the pain up into chest Went to ER and sent home Then seen here--and finally to Dr Thomas Villarreal. Stress test was okay Gassy type feeling  No tobacco Rare soda, usually 2-3 cups coffee Takes the omerprazole daily--hasn't seemed to help. On empty stomach Unclear if symptoms related to eating No NSAIDs  Current Outpatient Prescriptions on File Prior to Visit  Medication Sig Dispense Refill  . omeprazole (PRILOSEC) 20 MG capsule Take 1 capsule (20 mg total) by mouth daily. In the am before breakfast  30 capsule  1   No current facility-administered medications on file prior to visit.    No Known Allergies  Past Medical History  Diagnosis Date  . Allergic rhinitis   . GERD (gastroesophageal reflux disease)     No past surgical history on file.  Family History  Problem Relation Age of Onset  . Hyperlipidemia Father   . Healthy Mother   . Kidney disease Maternal Grandfather     History   Social History  . Marital Status: Married    Spouse Name: N/A    Number of Children: 1  . Years of Education: N/A   Occupational History  . Pizza Maker/Manager Other    [employer]   Social History Main Topics  . Smoking status: Former Smoker -- 1.00 packs/day for 15 years    Types: Cigarettes    Quit date: 01/23/2004  . Smokeless tobacco: Never Used  . Alcohol Use: Yes   Comment: Occasional  . Drug Use: No  . Sexual Activity: Not on file   Other Topics Concern  . Not on file   Social History Narrative   Married; 1 daughter      Thomas Villarreal      No regular exercise      Caffeine: 3/day         Review of Systems Appetite is okay--but tries to be careful Weight is stable No cough Gets SOB with the symptoms--better with sighing breaths Bowels slow but regular-- usually daily. Occasional dark or chalky (vague with history)    Objective:   Physical Exam  Constitutional: He appears well-developed and well-nourished. No distress.  Neck: Normal range of motion. Neck supple. No thyromegaly present.  Cardiovascular: Normal rate, regular rhythm and normal heart sounds.  Exam reveals no gallop.   No murmur heard. Pulmonary/Chest: Effort normal and breath sounds normal. No respiratory distress. He has no wheezes. He has no rales.  Abdominal: Soft. Bowel sounds are normal. He exhibits no distension. There is no tenderness. There is no rebound and no guarding.  Murphy's sign absent  Musculoskeletal: He exhibits no edema and no tenderness.  Lymphadenopathy:    He has no cervical adenopathy.  Psychiatric:  Mildly anxious--his usual          Assessment & Plan:

## 2014-02-06 ENCOUNTER — Encounter: Payer: Self-pay | Admitting: Internal Medicine

## 2014-02-06 ENCOUNTER — Ambulatory Visit: Payer: Self-pay | Admitting: Internal Medicine

## 2014-02-06 DIAGNOSIS — R1013 Epigastric pain: Secondary | ICD-10-CM

## 2014-02-07 ENCOUNTER — Encounter: Payer: Self-pay | Admitting: Internal Medicine

## 2014-02-18 ENCOUNTER — Ambulatory Visit (INDEPENDENT_AMBULATORY_CARE_PROVIDER_SITE_OTHER): Payer: BC Managed Care – PPO | Admitting: Internal Medicine

## 2014-02-18 ENCOUNTER — Encounter: Payer: Self-pay | Admitting: Internal Medicine

## 2014-02-18 VITALS — BP 118/70 | HR 64 | Temp 98.4°F | Wt 170.0 lb

## 2014-02-18 DIAGNOSIS — R1013 Epigastric pain: Secondary | ICD-10-CM

## 2014-02-18 MED ORDER — OMEPRAZOLE 20 MG PO CPDR: 20.0000 mg | DELAYED_RELEASE_CAPSULE | Freq: Two times a day (BID) | ORAL | Status: DC

## 2014-02-18 NOTE — Progress Notes (Signed)
   Subjective:    Patient ID: Thomas Villarreal, male    DOB: July 13, 1975, 38 y.o.   MRN: [id number]  HPI Somewhat better Not as fearful about something serious Sleeping some better with elevation of his head  Has decreased his eating--cut out evening food Weight is down 5#  Still on the omeprazole every morning  Still gets the discomfort in chest and at times down his arms Starts in epigastrium then to both sides of sternum No swallowing problems No vomiting--still some nausea Does still have some increased symptoms post prandial--he is still concerned about gallbladder function  Current Outpatient Prescriptions on File Prior to Visit  Medication Sig Dispense Refill  . omeprazole (PRILOSEC) 20 MG capsule Take 1 capsule (20 mg total) by mouth daily. In the am before breakfast  30 capsule  1   No current facility-administered medications on file prior to visit.    No Known Allergies  Past Medical History  Diagnosis Date  . Allergic rhinitis   . GERD (gastroesophageal reflux disease)     No past surgical history on file.  Family History  Problem Relation Age of Onset  . Hyperlipidemia Father   . Healthy Mother   . Kidney disease Maternal Grandfather     History   Social History  . Marital Status: Married    Spouse Name: N/A    Number of Children: 1  . Years of Education: N/A   Occupational History  . Pizza Maker/Manager Other    [employer]   Social History Main Topics  . Smoking status: Former Smoker -- 1.00 packs/day for 15 years    Types: Cigarettes    Quit date: 01/23/2004  . Smokeless tobacco: Never Used  . Alcohol Use: Yes     Comment: Occasional  . Drug Use: No  . Sexual Activity: Not on file   Other Topics Concern  . Not on file   Social History Narrative   Married; 1 daughter      Brett Albino maker/manager-[employer]      No regular exercise      Caffeine: 3/day         Review of Systems Bowels are okay--normal for him No fever    Objective:   Physical Exam  Constitutional: He appears well-developed and well-nourished. No distress.  Neck: Normal range of motion. Neck supple.  Pulmonary/Chest: Effort normal and breath sounds normal. No respiratory distress. He has no wheezes. He has no rales.  Abdominal: Soft. He exhibits no distension. There is no tenderness. There is no rebound and no guarding.  Lymphadenopathy:    He has no cervical adenopathy.          Assessment & Plan:

## 2014-02-18 NOTE — Progress Notes (Signed)
Pre visit review using our clinic review tool, if applicable. No additional management support is needed unless otherwise documented below in the visit note. 

## 2014-02-18 NOTE — Assessment & Plan Note (Signed)
Has responded some to antireflux measures --cut out evening eating and raised HOB He has lost 5# though (could be from stopping evening eating) He wonders about HIDA to check gallbladder function ---I would leave this up to GI consultant Will increase PPI to bid

## 2014-03-05 ENCOUNTER — Ambulatory Visit (INDEPENDENT_AMBULATORY_CARE_PROVIDER_SITE_OTHER): Payer: BC Managed Care – PPO | Admitting: Physician Assistant

## 2014-03-05 ENCOUNTER — Encounter: Payer: Self-pay | Admitting: Physician Assistant

## 2014-03-05 VITALS — BP 122/70 | HR 72 | Ht 67.0 in | Wt 173.2 lb

## 2014-03-05 DIAGNOSIS — K219 Gastro-esophageal reflux disease without esophagitis: Secondary | ICD-10-CM

## 2014-03-05 DIAGNOSIS — R079 Chest pain, unspecified: Secondary | ICD-10-CM

## 2014-03-05 NOTE — Patient Instructions (Signed)
Stay on Prilosec 20 mg, take 1 tab twice daily for 2 months.  Then you can try to decrease to once daily. We have given you Reflux information.  Call us back at [phone] when you check your schedule for an Endoscopy with Dr. Russella Dar.

## 2014-03-05 NOTE — Progress Notes (Signed)
Subjective:    Patient ID: Thomas Villarreal, male    DOB: Jul 20, 1975, 38 y.o.   MRN: [id number]  HPI  Thomas Villarreal is a pleasant 38 year old male referred today by Dr. Alphonsus Villarreal. He is new to GI and comes in with complaints of subxiphoid pain x4-6 weeks. At initial onset he had some mild associated nausea and proceeded to the emergency room because he was worried that he was having a heart attack. Initial workup was negative and he was subsequently referred to cardiology and has undergone stress testing etc. all of which has been unremarkable. He has been started on omeprazole 20 mg twice daily and says that he does feel better. He says his symptoms were definitely worse at night as soon as he would lie down and did not seem to be exertional. He says he is sleeping with the head of his bed somewhat elevated and not eating in the evening and feels that this has helped as well. He still gets a fullness in his throat at times and some mild chest discomfort. He points to his subxiphoid area. He has no complaints of dysphagia or odynophagia. He does get a sense of acid reflux at times. He says he does a lot of bending over at work, and is employed in [employer]. He feels that that was aggravating his symptoms at times as well. Recent labs have been unremarkable including CBC andCMET. Upper abdominal ultrasound was done August 2015 and is unremarkable. Though he does feel better at this time he is concerned about a diagnosis and wonders if he needs further gallbladder testing.    Review of Systems  Constitutional: Negative.   HENT: Negative.   Eyes: Negative.   Respiratory: Negative.   Cardiovascular: Positive for chest pain.  Gastrointestinal: Positive for abdominal pain.  Endocrine: Negative.   Genitourinary: Negative.   Musculoskeletal: Negative.   Allergic/Immunologic: Negative.   Neurological: Negative.   Hematological: Negative.   Psychiatric/Behavioral: The patient is  nervous/anxious.    Outpatient Prescriptions Prior to Visit  Medication Sig Dispense Refill  . omeprazole (PRILOSEC) 20 MG capsule Take 1 capsule (20 mg total) by mouth 2 (two) times daily. In the am before breakfast  60 capsule  3   No facility-administered medications prior to visit.      No Known Allergies Patient Active Problem List   Diagnosis Date Noted  . Abdominal pain, epigastric 02/04/2014  . GERD (gastroesophageal reflux disease) 01/22/2014  . Low back pain 05/25/2012  . Routine general medical examination at a health care facility 10/21/2011  . ALLERGIC RHINITIS 06/11/2008   family history includes Healthy in his mother; Hyperlipidemia in his father; Kidney disease in his maternal grandfather. History  Substance Use Topics  . Smoking status: Former Smoker -- 1.00 packs/day for 15 years    Types: Cigarettes    Quit date: 01/23/2004  . Smokeless tobacco: Never Used  . Alcohol Use: Yes     Comment: Occasional    Objective:   Physical Exam well-developed white male in no acute distress, blood pressure 122/70 pulse 72 height 5 foot 7 weight 173, HEENT; nontraumatic normocephalic EOMI PERRLA sclera anicteric, Supple; no JVD, Cardiovascular ;regular rate and rhythm with S1-S2 no murmur or gallop, Pulmonary; clear bilaterally, Abdomen soft basically nontender no palpable mass or hepatosplenomegaly bowel sounds are present, Rectal; exam not done, Extremities; no clubbing cyanosis or edema skin warm and dry, Psych ;mood and affect appropriate        Assessment & Plan:  #  6  38 year old male with 6 week history of subxiphoid pain and fullness in the throat both consistent with GERD and possible mild esophagitis. Cardiac workup has been done and is negative Symptoms improved with twice a day PPI therapy. Patient has had a negative ultrasound. Doubt symptoms related to biliary dyskinesia   Plan; Patient to continue an antireflux regimen including elevation of the head of the  bed and n.p.o. for 2-3 hours prior to bedtime, he was given educational reading material today Continue twice-daily Prilosec 20 mg for 2 months and at that time if doing well and asymptomatic he is advised he can try decreasing to once daily a.c. Dinner Advised upper endoscopy-he will call back to schedule with Dr. Russella Villarreal We discussed [facility] HIDA scan, I do not think this is necessary at present however if symptoms persist and EGD is negative this can be done to complete workup

## 2014-03-05 NOTE — Progress Notes (Signed)
Reviewed and agree with management plan.  Oluwatoni Rotunno T. [place] Landgrebe, MD FACG 

## 2014-04-09 ENCOUNTER — Ambulatory Visit: Payer: BC Managed Care – PPO | Admitting: Internal Medicine

## 2014-04-09 ENCOUNTER — Ambulatory Visit (INDEPENDENT_AMBULATORY_CARE_PROVIDER_SITE_OTHER): Payer: BC Managed Care – PPO | Admitting: Gastroenterology

## 2014-04-09 ENCOUNTER — Encounter: Payer: Self-pay | Admitting: Gastroenterology

## 2014-04-09 VITALS — BP 98/60 | HR 68 | Ht 68.0 in | Wt 175.0 lb

## 2014-04-09 DIAGNOSIS — R194 Change in bowel habit: Secondary | ICD-10-CM

## 2014-04-09 DIAGNOSIS — K219 Gastro-esophageal reflux disease without esophagitis: Secondary | ICD-10-CM

## 2014-04-09 DIAGNOSIS — R140 Abdominal distension (gaseous): Secondary | ICD-10-CM

## 2014-04-09 MED ORDER — PANTOPRAZOLE SODIUM 40 MG PO TBEC: 40.0000 mg | DELAYED_RELEASE_TABLET | Freq: Two times a day (BID) | ORAL | Status: DC

## 2014-04-09 NOTE — Progress Notes (Addendum)
    History of Present Illness: This is a 38 year old male evaluated by Thomas Villarreal one month ago for suspected reflux symptoms. He states his reflux symptoms have improved on omeprazole 20 mg daily although he notes frequent, persistent regurgitation and substernal reflux symptoms primarily occurring at night. Difficult to follow his history as he is somewhat vague in describing his symptoms. Perhaps there is some language/cultural barrier. He relates a problem that has been present for quite some time which is abdominal bloating, intestinal gas, flatulence and urgent loose stools. He feels these symptoms have worsened since he was started on omeprazole. He was recommended to undergo upper endoscopy at his prior office visit but has not done so.   Current Medications, Allergies, Past Medical History, Past Surgical History, Family History and Social History were reviewed in [facility] electronic medical record.  Physical Exam: General: Well developed , well nourished, no acute distress Head: Normocephalic and atraumatic Eyes:  sclerae anicteric, EOMI Ears: Normal auditory acuity Mouth: No deformity or lesions Lungs: Clear throughout to auscultation Heart: Regular rate and rhythm; no murmurs, rubs or bruits Abdomen: Soft, non tender and non distended. No masses, hepatosplenomegaly or hernias noted. Normal Bowel sounds Musculoskeletal: Symmetrical with no gross deformities  Pulses:  Normal pulses noted Extremities: No clubbing, cyanosis, edema or deformities noted Neurological: Alert oriented x 4, grossly nonfocal Psychological:  Alert and cooperative. Normal mood and affect  Assessment and Recommendations:  1. GERD, urgent and loose bowel movements, abdominal bloating, flatulence. His reflux symptoms are not under adequate control and his diarrhea and bloating appear to have worsened on omeprazole therefore change to pantoprazole 40 mg twice daily. All standard antireflux measures  especially avoiding any late night food and using 4 inch bed blocks. I advised him to schedule upper endoscopy for further evaluation of his refractory reflux symptoms. He states he will call back to schedule. Trial of Gas-X 4 times a day and a low gas diet. Consider celiac disease, thyroid and stool testing if his symptoms persist. Return office visit in 4 weeks.

## 2014-04-09 NOTE — Patient Instructions (Addendum)
Please follow up with Dr Russella DarStark on 04/30/14 9 am. We have changed your medications, stop the omeprazole and start pantoprazole 40 mg twice daily.  This has been sent to your pharmacy. Low gas diet given to you, you may take Gas-X 4 times daily as needed.  CC:  Tillman Abideichard Letvak MD

## 2014-04-30 ENCOUNTER — Encounter: Payer: Self-pay | Admitting: Gastroenterology

## 2014-04-30 ENCOUNTER — Ambulatory Visit (INDEPENDENT_AMBULATORY_CARE_PROVIDER_SITE_OTHER): Payer: BC Managed Care – PPO | Admitting: Gastroenterology

## 2014-04-30 VITALS — BP 100/70 | HR 54 | Ht 67.0 in | Wt 174.0 lb

## 2014-04-30 DIAGNOSIS — R0789 Other chest pain: Secondary | ICD-10-CM

## 2014-04-30 DIAGNOSIS — K219 Gastro-esophageal reflux disease without esophagitis: Secondary | ICD-10-CM

## 2014-04-30 NOTE — Progress Notes (Signed)
    History of Present Illness: This is a 38 year old malewith chest pain, abdominal bloating, diarrhea and GERD. He relates his diarrhea resolved after discontinuing omeprazole and beginning pantoprazole. He has frequent mild abdominal bloating and intermittent chest tightness that is not necessarily related to meals. His reflux symptoms have substantially improved on pantoprazole and he decreased his dosing to once daily on his own.    Current Medications, Allergies, Past Medical History, Past Surgical History, Family History and Social History were reviewed in [facility] electronic medical record.  Physical Exam: General: Well developed , well nourished, no acute distress Head: Normocephalic and atraumatic Eyes:  sclerae anicteric, EOMI Ears: Normal auditory acuity Mouth: No deformity or lesions Lungs: Clear throughout to auscultation, tenderness over lower sternum/xiphoid process Heart: Regular rate and rhythm; no murmurs, rubs or bruits Abdomen: Soft, non tender and non distended. No masses, hepatosplenomegaly or hernias noted. Normal Bowel sounds Musculoskeletal: Symmetrical with no gross deformities  Pulses:  Normal pulses noted Extremities: No clubbing, cyanosis, edema or deformities noted Neurological: Alert oriented x 4, grossly nonfocal Psychological:  Alert and cooperative. Anxious.   Assessment and Recommendations:  1. Chest pain. One component of his chest pain appears to be musculoskeletal with chest wall tenderness elicited on physical exam. Follow-up with PCP.  2. GERD. Continue standard antireflux measures and pantoprazole 40 mg daily. Schedule upper endoscopy. The risks, benefits, and alternatives to endoscopy with possible biopsy and possible dilation were discussed with the patient and they consent to proceed.   3. Mild abdominal bloating. Antireflux measures, minimize lactose, minimize high fat foods.

## 2014-04-30 NOTE — Patient Instructions (Signed)
You have been scheduled for an endoscopy . Please follow the written instructions given to you at your visit today.  If you use inhalers (even only as needed), please bring them with you on the day of your procedure. Your physician has requested that you go to www.startemmi.com and enter the access code given to you at your visit today. This web site gives a general overview about your procedure. However, you should still follow specific instructions given to you by our office regarding your preparation for the procedure. 

## 2014-05-07 ENCOUNTER — Encounter: Payer: BC Managed Care – PPO | Admitting: Gastroenterology

## 2014-05-12 ENCOUNTER — Encounter: Payer: BC Managed Care – PPO | Admitting: Gastroenterology

## 2014-05-14 ENCOUNTER — Ambulatory Visit (AMBULATORY_SURGERY_CENTER): Payer: BC Managed Care – PPO | Admitting: Gastroenterology

## 2014-05-14 ENCOUNTER — Encounter: Payer: Self-pay | Admitting: Gastroenterology

## 2014-05-14 VITALS — BP 114/75 | HR 56 | Temp 96.3°F | Resp 16 | Ht 67.0 in | Wt 174.0 lb

## 2014-05-14 DIAGNOSIS — K219 Gastro-esophageal reflux disease without esophagitis: Secondary | ICD-10-CM

## 2014-05-14 DIAGNOSIS — K2950 Unspecified chronic gastritis without bleeding: Secondary | ICD-10-CM

## 2014-05-14 DIAGNOSIS — K2970 Gastritis, unspecified, without bleeding: Secondary | ICD-10-CM

## 2014-05-14 DIAGNOSIS — R0789 Other chest pain: Secondary | ICD-10-CM

## 2014-05-14 MED ORDER — SODIUM CHLORIDE 0.9 % IV SOLN: 500.0000 mL | INTRAVENOUS | Status: DC

## 2014-05-14 NOTE — Op Note (Signed)
[facility] [address] [place], [id number]   ENDOSCOPY PROCEDURE REPORT  PATIENT: Thomas Villarreal, Thomas Villarreal  MR#: [id number] BIRTHDATE: 07/02/1975 , 38  yrs. old GENDER: male ENDOSCOPIST: Meryl DareMalcolm T Zarinah Oviatt, MD, [facility] REFERRED BY:  Tillman AbideLetvak, Richard PROCEDURE DATE:  05/14/2014 PROCEDURE:  EGD w/ biopsy ASA CLASS:     Class II INDICATIONS:  chest pain and history of esophageal reflux. MEDICATIONS: Monitored anesthesia care and Propofol 250 mg IV TOPICAL ANESTHETIC: none DESCRIPTION OF PROCEDURE: After the risks benefits and alternatives of the procedure were thoroughly explained, informed consent was obtained.  The LB [id number] endoscope was introduced through the mouth and advanced to the second portion of the duodenum , Without limitations.  The instrument was slowly withdrawn as the mucosa was fully examined.    STOMACH:  Mild gastritis, non erosive, was found in the gastric fundus and gastric body.  Multiple biopsies were performed.   The stomach otherwise appeared normal. ESOPHAGUS: The mucosa of the esophagus appeared normal. DUODENUM: The duodenal mucosa showed no abnormalities in the bulb and 2nd part of the duodenum.  Retroflexed views revealed no abnormalities.   The scope was then withdrawn from the patient and the procedure completed.  COMPLICATIONS: There were no immediate complications.  ENDOSCOPIC IMPRESSION: 1.   Mild gastritis in the gastric fundus and gastric body; multiple biopsies were performed 2.   The EGD otherwise appeared normal  RECOMMENDATIONS: 1.  Anti-reflux regimen 2.  Continue PPI 3.  Await pathology results 4.  No GI cause for chest pain found. Follow up with PCP.  eSigned:  Meryl DareMalcolm T Marton Malizia, MD, [facility] 05/14/2014 10:49 AM

## 2014-05-14 NOTE — Progress Notes (Signed)
Called to room to assist during endoscopic procedure.  Patient ID and intended procedure confirmed with present staff. Received instructions for my participation in the procedure from the performing physician.  

## 2014-05-14 NOTE — Progress Notes (Signed)
When tape was removed from IV, pt arm was very red in the area of the tape and had little bumps on it, pt denies it itching at this time, also pt chest was red where tabs Were placed, spoke with Dr Russella DarStark, advise hydrocortisone cream or benadryl cream would help, advised pt of this and if symptoms get worse to call-adm.

## 2014-05-14 NOTE — Patient Instructions (Signed)

## 2014-05-14 NOTE — Progress Notes (Signed)
A/ox3, pleased with MAC, report to RN 

## 2014-05-15 ENCOUNTER — Telehealth: Payer: Self-pay | Admitting: *Deleted

## 2014-05-15 NOTE — Telephone Encounter (Signed)
Left message on f/u callback 

## 2014-05-19 ENCOUNTER — Encounter: Payer: Self-pay | Admitting: Gastroenterology

## 2014-07-22 ENCOUNTER — Encounter: Payer: Self-pay | Admitting: Internal Medicine

## 2014-07-22 ENCOUNTER — Ambulatory Visit (INDEPENDENT_AMBULATORY_CARE_PROVIDER_SITE_OTHER): Payer: BLUE CROSS/BLUE SHIELD | Admitting: Internal Medicine

## 2014-07-22 VITALS — BP 110/70 | HR 67 | Temp 98.1°F | Wt 168.0 lb

## 2014-07-22 DIAGNOSIS — K2970 Gastritis, unspecified, without bleeding: Secondary | ICD-10-CM

## 2014-07-22 MED ORDER — PANTOPRAZOLE SODIUM 40 MG PO TBEC: 40.0000 mg | DELAYED_RELEASE_TABLET | Freq: Every day | ORAL | Status: DC

## 2014-07-22 NOTE — Assessment & Plan Note (Signed)
Negative for H pylori Stopped the PPI Seems to have reflux symptoms as well  Will have him restart the protonix Consider trying sucralfate Discussed using tums or maalox for acute symptoms

## 2014-07-22 NOTE — Patient Instructions (Signed)
Please restart the pantoprazole daily. If you have symptoms, try tums or maalox when it occurs (like the chest discomfort) Call me if the night or other chest symptoms continue--there is another medication we can try.

## 2014-07-22 NOTE — Progress Notes (Signed)
   Subjective:    Patient ID: Thomas Villarreal, male    DOB: May 13, 1976, 39 y.o.   MRN: [id number]  HPI Here for follow up of abdominal problems Reviewed EGD and pathology reports  He took the PPI for 3 months total Off for the last 2 weeks Would still get the chest pain sensation Has tried to eat better--avoiding fatty/fried food and tries not to eat close to bedtime Sleeps on wedge/2 pillows to keep up more Doesn't notice any difference since off the PPI  No swallowing problems  Current Outpatient Prescriptions on File Prior to Visit  Medication Sig Dispense Refill  . Oxymetazoline HCl (NASAL SPRAY NA) Place into the nose.     No current facility-administered medications on file prior to visit.    Allergies  Allergen Reactions  . Adhesive [Tape] Rash    Past Medical History  Diagnosis Date  . Allergic rhinitis   . GERD (gastroesophageal reflux disease)   . Chronic kidney disease     kidney stones    No past surgical history on file.  Family History  Problem Relation Age of Onset  . Hyperlipidemia Father   . Healthy Mother   . Kidney disease Maternal Grandfather   . Colon cancer Neg Hx   . Esophageal cancer Neg Hx   . Rectal cancer Neg Hx   . Stomach cancer Neg Hx     History   Social History  . Marital Status: Married    Spouse Name: N/A    Number of Children: 1  . Years of Education: N/A   Occupational History  . Pizza Maker/Manager Other    [employer]   Social History Main Topics  . Smoking status: Former Smoker -- 1.00 packs/day for 15 years    Types: Cigarettes    Quit date: 01/23/2004  . Smokeless tobacco: Never Used  . Alcohol Use: Yes     Comment: Occasional  . Drug Use: No  . Sexual Activity: Not on file   Other Topics Concern  . Not on file   Social History Narrative   Married; 1 daughter      Brett Albinoizza maker/manager-[employer]      No regular exercise      Caffeine: 3/day         Review of Systems  Weight is down a few pounds---  may be related to less fatty food Did have transient change in vision in right eye---actually got better when he started running in soccer game     Objective:   Physical Exam  Constitutional: He appears well-developed and well-nourished. No distress.  Abdominal: Soft. Bowel sounds are normal. He exhibits no distension and no mass. There is no tenderness. There is no rebound and no guarding.          Assessment & Plan:

## 2014-09-20 ENCOUNTER — Emergency Department: Payer: Self-pay | Admitting: Emergency Medicine

## 2014-09-20 LAB — COMPREHENSIVE METABOLIC PANEL
Albumin: 4.8 g/dL
Alkaline Phosphatase: 56 U/L
Anion Gap: 8 (ref 7–16)
BUN: 15 mg/dL
Bilirubin,Total: 1 mg/dL
Calcium, Total: 9.8 mg/dL
Chloride: 105 mmol/L
Co2: 26 mmol/L
Creatinine: 0.98 mg/dL
EGFR (African American): >60
EGFR (Non-African Amer.): >60
Glucose: 97 mg/dL
Potassium: 4.4 mmol/L
SGOT(AST): 19 U/L
SGPT (ALT): 26 U/L
Sodium: 139 mmol/L
Total Protein: 7.9 g/dL

## 2014-09-20 LAB — CBC
HCT: 46.6 % (ref 40.0–52.0)
HGB: 15.5 g/dL (ref 13.0–18.0)
MCH: 28.9 pg (ref 26.0–34.0)
MCHC: 33.4 g/dL (ref 32.0–36.0)
MCV: 87 fL (ref 80–100)
PLATELETS: 198 10*3/uL (ref 150–440)
RBC: 5.38 10*6/uL (ref 4.40–5.90)
RDW: 13.6 % (ref 11.5–14.5)
WBC: 6.1 10*3/uL (ref 3.8–10.6)

## 2014-09-20 LAB — LIPASE, BLOOD: Lipase: 29 U/L

## 2014-09-26 HISTORY — PX: CHOLECYSTECTOMY: SHX55

## 2015-01-20 ENCOUNTER — Ambulatory Visit: Payer: BLUE CROSS/BLUE SHIELD | Admitting: Internal Medicine

## 2015-01-27 ENCOUNTER — Ambulatory Visit (INDEPENDENT_AMBULATORY_CARE_PROVIDER_SITE_OTHER): Payer: BLUE CROSS/BLUE SHIELD | Admitting: Internal Medicine

## 2015-01-27 ENCOUNTER — Encounter: Payer: Self-pay | Admitting: Internal Medicine

## 2015-01-27 VITALS — BP 118/70 | HR 77 | Temp 97.8°F | Wt 158.0 lb

## 2015-01-27 DIAGNOSIS — R42 Dizziness and giddiness: Secondary | ICD-10-CM

## 2015-01-27 DIAGNOSIS — K219 Gastro-esophageal reflux disease without esophagitis: Secondary | ICD-10-CM | POA: Diagnosis not present

## 2015-01-27 NOTE — Assessment & Plan Note (Signed)
Very mild and intermittent now Most of his symptoms were apparently from chronic gallbladder disease No regular meds now

## 2015-01-27 NOTE — Assessment & Plan Note (Signed)
Mild and seems to be better with his attention to better hydration  No action needed

## 2015-01-27 NOTE — Progress Notes (Signed)
Pre visit review using our clinic review tool, if applicable. No additional management support is needed unless otherwise documented below in the visit note. 

## 2015-01-27 NOTE — Progress Notes (Signed)
   Subjective:    Patient ID: Thomas Villarreal, male    DOB: 1976/02/10, 39 y.o.   MRN: [id number]  HPI Here for follow up of GI issues  Did wind up having his gallbladder out in April Normal ultrasound but abnormal HIDA scan (or borderline) This seems to have caused the problems Pathology showed chronic inflammation  Now off the PPI Pain is gone No significant acid symptoms either---just rare now (will use prn antacids)  Drinking a lot of water--especially in AM Forgets later Had been getting dizziness later in the day--this is better with increased fluids  Current Outpatient Prescriptions on File Prior to Visit  Medication Sig Dispense Refill  . Oxymetazoline HCl (NASAL SPRAY NA) Place into the nose.     No current facility-administered medications on file prior to visit.    Allergies  Allergen Reactions  . Adhesive [Tape] Rash    Past Medical History  Diagnosis Date  . Allergic rhinitis   . GERD (gastroesophageal reflux disease)   . Chronic kidney disease     kidney stones    Past Surgical History  Procedure Laterality Date  . Cholecystectomy  4/16    Family History  Problem Relation Age of Onset  . Hyperlipidemia Father   . Healthy Mother   . Kidney disease Maternal Grandfather   . Colon cancer Neg Hx   . Esophageal cancer Neg Hx   . Rectal cancer Neg Hx   . Stomach cancer Neg Hx     History   Social History  . Marital Status: Married    Spouse Name: N/A  . Number of Children: 1  . Years of Education: N/A   Occupational History  . Pizza Maker/Manager Other    [employer]   Social History Main Topics  . Smoking status: Former Smoker -- 1.00 packs/day for 15 years    Types: Cigarettes    Quit date: 01/23/2004  . Smokeless tobacco: Never Used  . Alcohol Use: Yes     Comment: Occasional  . Drug Use: No  . Sexual Activity: Not on file   Other Topics Concern  . Not on file   Social History Narrative   Married; 1 daughter      Thomas Villarreal  maker/manager-[employer]      No regular exercise      Caffeine: 3/day         Review of Systems No palpitations like he had before Weight is down 10# since last visit--eating better Had insurance blood work--everything is fine (reviewed and will scan)    Objective:   Physical Exam  Constitutional: He appears well-developed and well-nourished. No distress.  Neck: Normal range of motion. Neck supple. No thyromegaly present.  Cardiovascular: Normal rate, regular rhythm and normal heart sounds.  Exam reveals no gallop.   No murmur heard. Pulmonary/Chest: Effort normal and breath sounds normal. No respiratory distress. He has no wheezes. He has no rales.  Abdominal: Soft. There is no tenderness.  Musculoskeletal: He exhibits no edema or tenderness.  Lymphadenopathy:    He has no cervical adenopathy.  Psychiatric: He has a normal mood and affect. His behavior is normal.          Assessment & Plan:

## 2015-11-12 IMAGING — US ABDOMEN ULTRASOUND LIMITED
1 series · 14 of 25 positions shown · non-contrast
Comparison: February 06, 2014

CLINICAL DATA: Crampy epigastric and right upper quadrant pain,
chronic

EXAM:
US ABDOMEN LIMITED - RIGHT UPPER QUADRANT

[Series 1: abdomen ultrasound limited · 0.20mm/px · 14 of 36 slices shown]
[im 1/36]
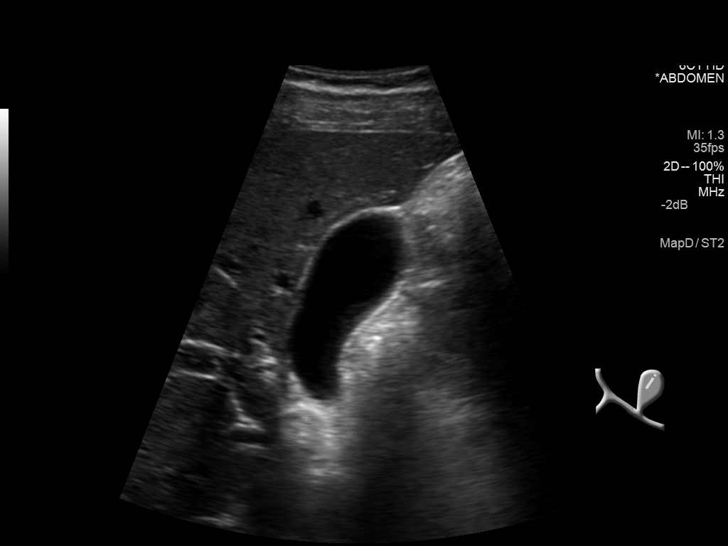
[im 3/36]
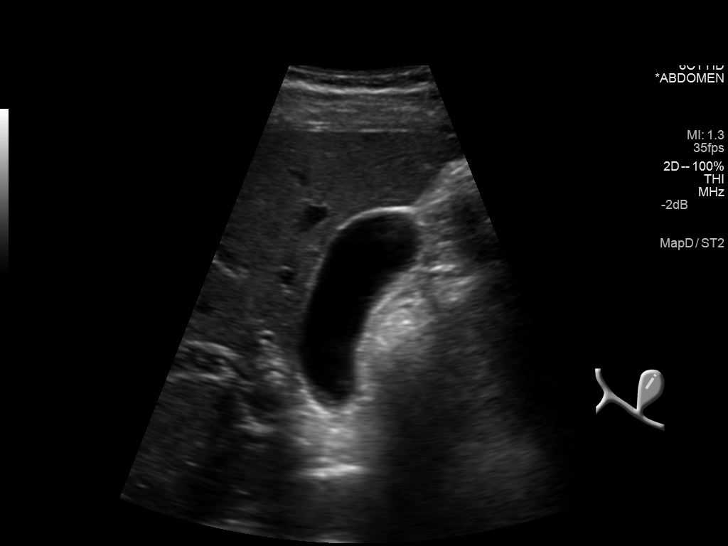
[im 6/36]
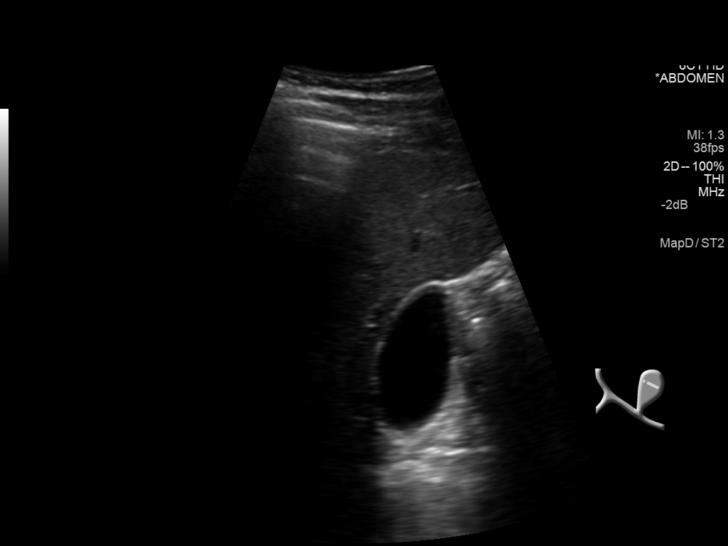
[im 9/36]
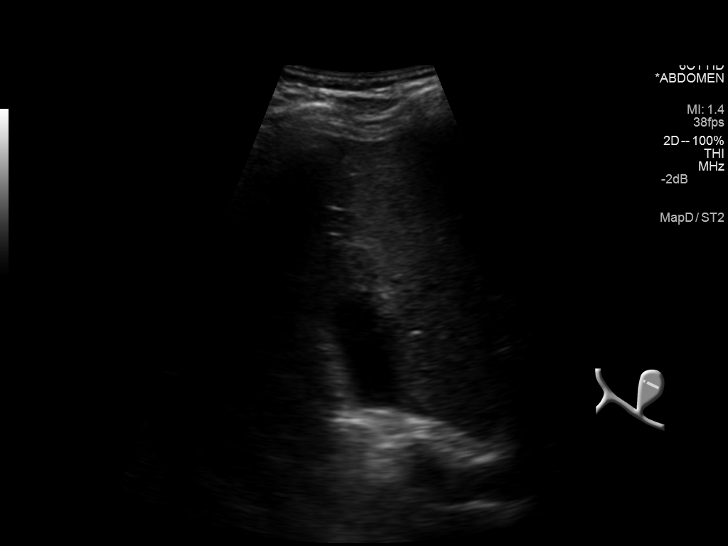
[im 12/36]
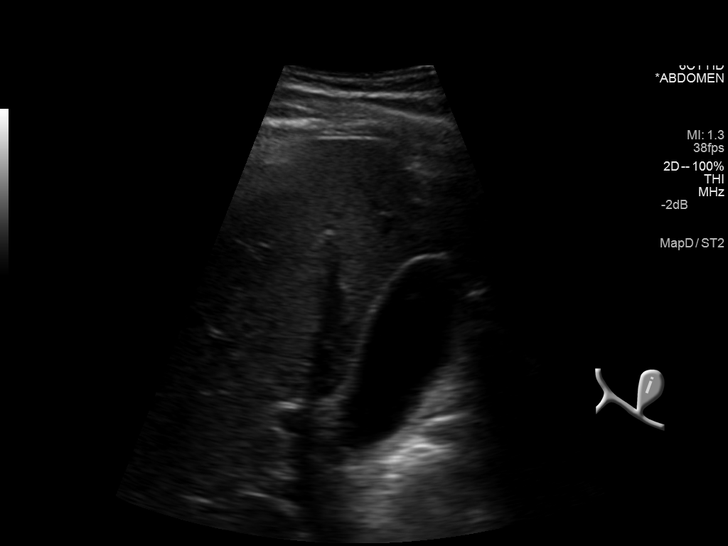
[im 14/36]
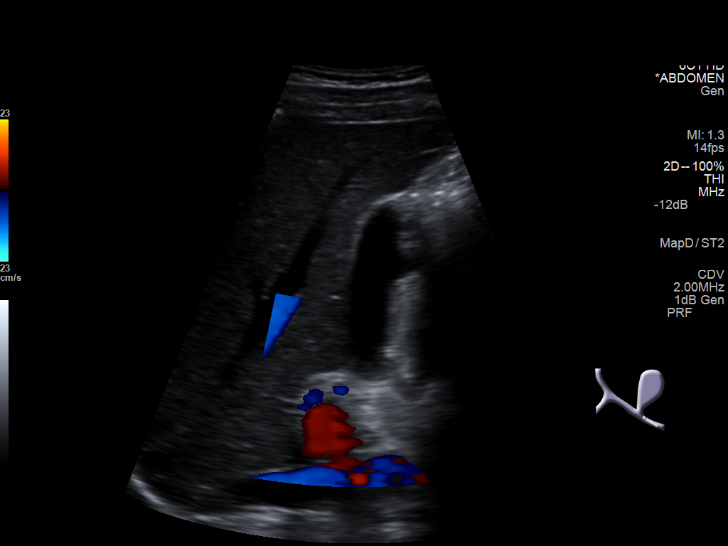
[im 17/36]
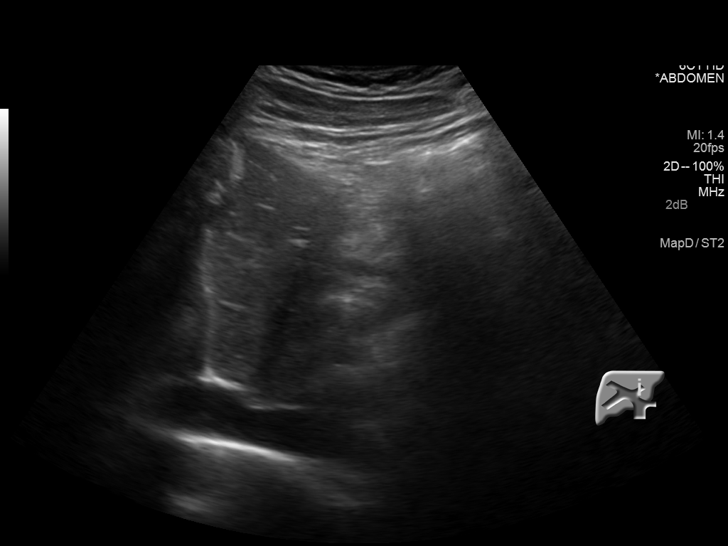
[im 19/36]
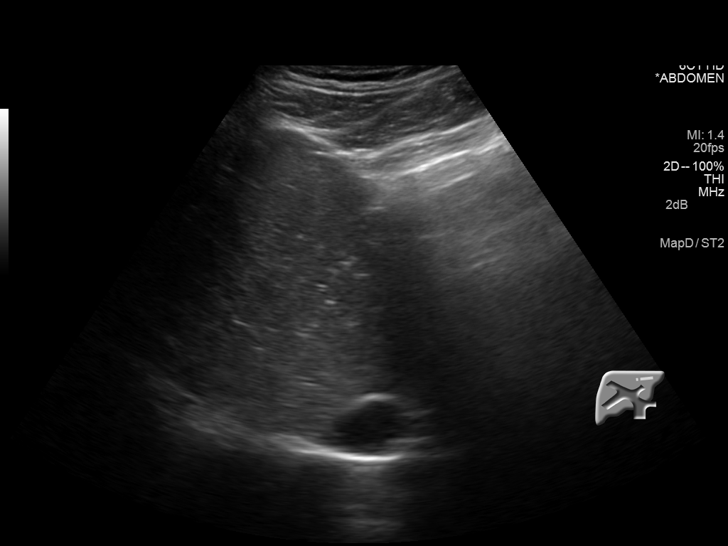
[im 22/36]
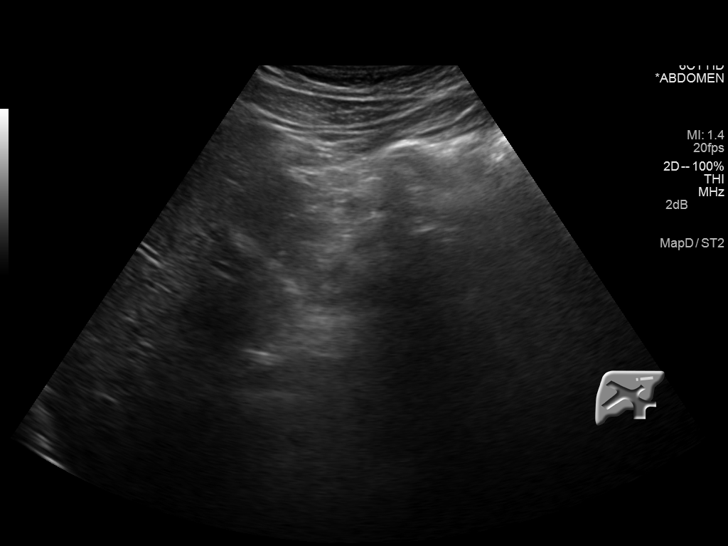
[im 24/36]
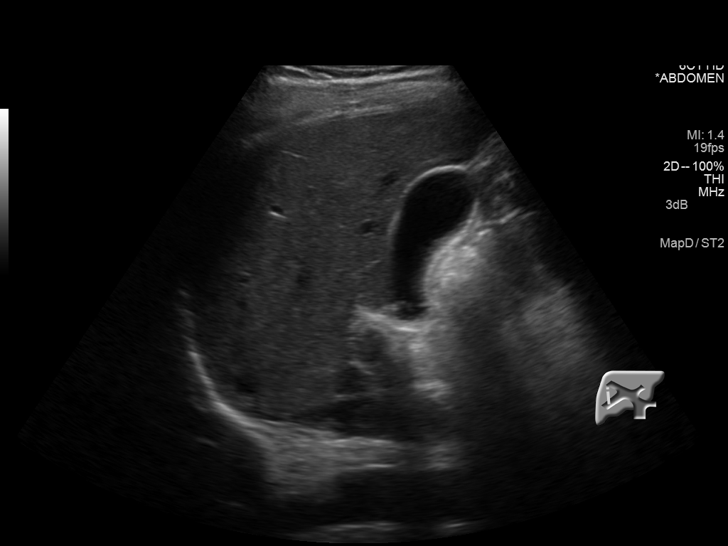
[im 27/36]
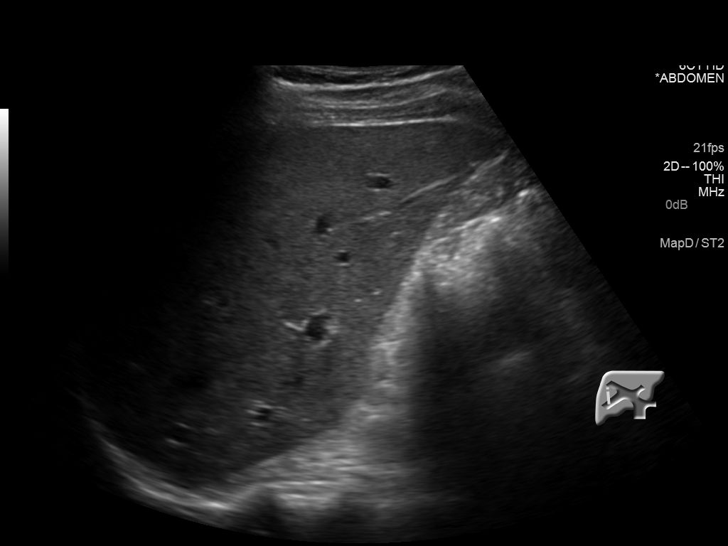
[im 30/36]
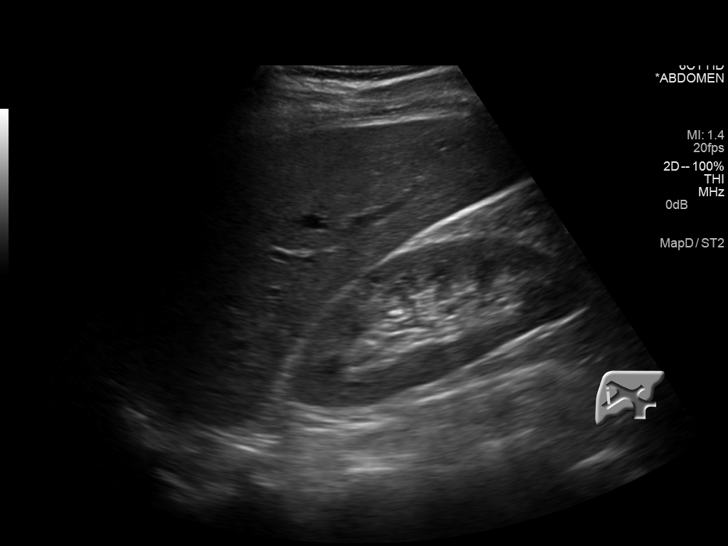
[im 33/36]
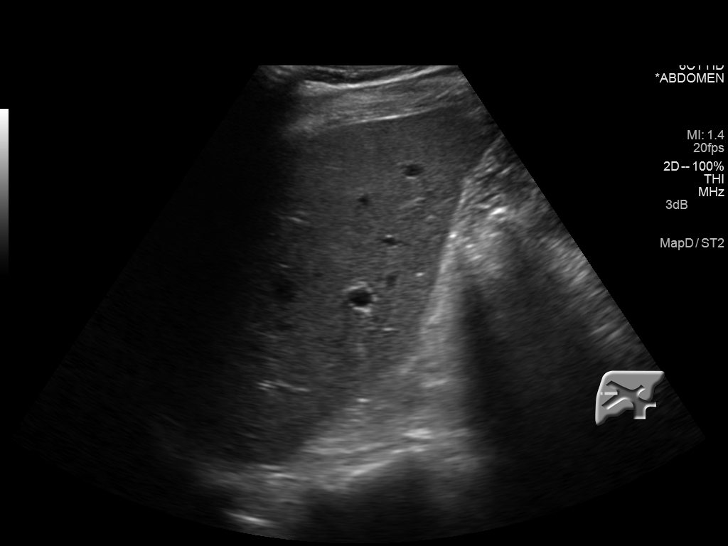
[im 36/36]
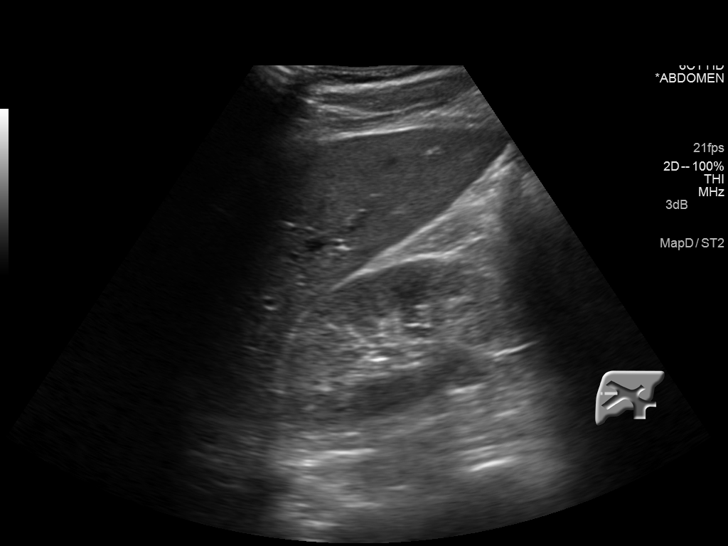

[14 of 25 positions shown; findings below may reference images not displayed]

FINDINGS: Gallbladder:

No gallstones or wall thickening visualized. There is no
pericholecystic fluid. No sonographic Murphy sign noted.

Common bile duct:

Diameter: 2 mm. There is no intrahepatic, common hepatic, or common
bile duct dilatation.

Liver:

No focal lesion identified. Within normal limits in parenchymal
echogenicity.
IMPRESSION: Study within normal limits.

## 2015-11-30 ENCOUNTER — Encounter: Payer: Self-pay | Admitting: *Deleted

## 2015-11-30 ENCOUNTER — Ambulatory Visit (INDEPENDENT_AMBULATORY_CARE_PROVIDER_SITE_OTHER): Payer: BLUE CROSS/BLUE SHIELD | Admitting: Internal Medicine

## 2015-11-30 ENCOUNTER — Encounter: Payer: Self-pay | Admitting: Internal Medicine

## 2015-11-30 VITALS — BP 102/72 | HR 70 | Temp 98.1°F | Wt 158.0 lb

## 2015-11-30 DIAGNOSIS — K4090 Unilateral inguinal hernia, without obstruction or gangrene, not specified as recurrent: Secondary | ICD-10-CM | POA: Diagnosis not present

## 2015-11-30 NOTE — Progress Notes (Signed)
Pre visit review using our clinic review tool, if applicable. No additional management support is needed unless otherwise documented below in the visit note. 

## 2015-11-30 NOTE — Progress Notes (Signed)
   Subjective:    Patient ID: Thomas CobiaSalvatore Gasiorowski, male    DOB: [birth year], 40 y.o.   MRN: [id number]  HPI He is here due to concern he has a hernia Was lifting kiddie swimming pool yesterday in yard Later he noticed a knot in groin--not into scrotum Comes and goes  He did motocross 2 days ago No falls but it does jostle his groin  Current Outpatient Prescriptions on File Prior to Visit  Medication Sig Dispense Refill  . Oxymetazoline HCl (NASAL SPRAY NA) Place into the nose. Reported on 11/30/2015     No current facility-administered medications on file prior to visit.    Allergies  Allergen Reactions  . Adhesive [Tape] Rash    Past Medical History  Diagnosis Date  . Allergic rhinitis   . GERD (gastroesophageal reflux disease)   . Chronic kidney disease     kidney stones    Past Surgical History  Procedure Laterality Date  . Cholecystectomy  4/16    Family History  Problem Relation Age of Onset  . Hyperlipidemia Father   . Healthy Mother   . Kidney disease Maternal Grandfather   . Colon cancer Neg Hx   . Esophageal cancer Neg Hx   . Rectal cancer Neg Hx   . Stomach cancer Neg Hx     Social History   Social History  . Marital Status: Married    Spouse Name: N/A  . Number of Children: 1  . Years of Education: N/A   Occupational History  . Pizza Maker/Manager Other    [employer]   Social History Main Topics  . Smoking status: Former Smoker -- 1.00 packs/day for 15 years    Types: Cigarettes    Quit date: 01/23/2004  . Smokeless tobacco: Never Used  . Alcohol Use: Yes     Comment: Occasional  . Drug Use: No  . Sexual Activity: Not on file   Other Topics Concern  . Not on file   Social History Narrative   Married; 1 daughter      Brett Albinoizza maker/manager-[employer]      No regular exercise      Caffeine: 3/day         Review of Systems  Bowels are okay---not straining regularly (just once in a while) No trouble voiding Still play soccer Has trip to  [place] in 3 weeks     Objective:   Physical Exam  Genitourinary:  Small reducible left inguinal hernia---bulge at inguinal ring Scrotum has some thickening of epididymis--but no mass or hernai          Assessment & Plan:

## 2015-11-30 NOTE — Assessment & Plan Note (Signed)
Small and reducible Discussed that it will eventually need surgery No need to do this prior to his upcoming trip No need to limit activities other than lifting, free weights, etc

## 2015-12-17 ENCOUNTER — Encounter: Payer: Self-pay | Admitting: General Surgery

## 2015-12-17 ENCOUNTER — Ambulatory Visit (INDEPENDENT_AMBULATORY_CARE_PROVIDER_SITE_OTHER): Payer: BLUE CROSS/BLUE SHIELD | Admitting: General Surgery

## 2015-12-17 VITALS — BP 106/58 | HR 58 | Resp 12 | Ht 69.0 in | Wt 157.0 lb

## 2015-12-17 DIAGNOSIS — K4090 Unilateral inguinal hernia, without obstruction or gangrene, not specified as recurrent: Secondary | ICD-10-CM

## 2015-12-17 NOTE — Progress Notes (Signed)
Patient ID: Thomas CobiaSalvatore Villarreal, male   DOB: 07/17/1975, 40 y.o.   MRN: [id number]  Chief Complaint  Patient presents with  . Other    left inguinal hernia    HPI Thomas CobiaSalvatore Villarreal is a 40 y.o. male.  Here today for evaluation of a left inguinal hernia.Patient states he noticed this area about three weeks ago. He states he was moving his daughter water pool and noticed this area. No pain, movies his bowel every two days.   The patient was born in [place] but has been residing in [place] for the past 20+ years.  [facility] year.   HPI  Past Medical History  Diagnosis Date  . Allergic rhinitis   . GERD (gastroesophageal reflux disease)   . Chronic kidney disease     kidney stones    Past Surgical History  Procedure Laterality Date  . Cholecystectomy  4/16    Family History  Problem Relation Age of Onset  . Hyperlipidemia Father   . Healthy Mother   . Kidney disease Maternal Grandfather   . Colon cancer Neg Hx   . Esophageal cancer Neg Hx   . Rectal cancer Neg Hx   . Stomach cancer Neg Hx     Social History Social History  Substance Use Topics  . Smoking status: Former Smoker -- 1.00 packs/day for 15 years    Types: Cigarettes    Quit date: 01/23/2004  . Smokeless tobacco: Never Used  . Alcohol Use: Yes     Comment: Occasional    Allergies  Allergen Reactions  . Adhesive [Tape] Rash    Current Outpatient Prescriptions  Medication Sig Dispense Refill  . Oxymetazoline HCl (NASAL SPRAY NA) Place into the nose. Reported on 12/17/2015     No current facility-administered medications for this visit.    Review of Systems Review of Systems  Constitutional: Negative.   Respiratory: Negative.   Cardiovascular: Negative.     Blood pressure 106/58, pulse 58, resp. rate 12, height 5\' 9"  (1.753 m), weight 157 lb (71.215 kg).  Physical Exam Physical Exam  Constitutional: He is oriented to  person, place, and time. He appears well-developed and well-nourished.  Eyes: Conjunctivae are normal. No scleral icterus.  Neck: Neck supple.  Cardiovascular: Normal rate, regular rhythm and normal heart sounds.   Pulmonary/Chest: Effort normal and breath sounds normal.  Abdominal: Soft. Normal appearance and bowel sounds are normal. There is no hepatomegaly. There is no tenderness. A hernia is present. Hernia confirmed positive in the left inguinal area. Hernia confirmed negative in the right inguinal area.    Lymphadenopathy:    He has no cervical adenopathy.  Neurological: He is alert and oriented to person, place, and time.  Skin: Skin is warm and dry.    Data Reviewed PCP note of 11/30/2015.  Assessment    Left inguinal hernia.    Plan    The patient is planning a three-week trip to [place].  He will consider elective repair after return.   Proper lifting technique reviewed.   Hernia precautions and incarceration were discussed with the patient. If they develop symptoms of an incarcerated hernia, they were encouraged to seek prompt medical attention.  I have recommended repair of the hernia using mesh on an outpatient basis in the near future. The risk of infection was reviewed. The role of prosthetic mesh to minimize the risk of recurrence was reviewed.  PCP:  Alphonsus SiasLetvak This information has been  scribed by Ples SpecterJessica Qualls CMA.   Earline MayotteByrnett, Jaylan Hinojosa W 12/17/2015, 9:55 PM

## 2015-12-17 NOTE — Patient Instructions (Signed)

## 2016-01-29 ENCOUNTER — Encounter: Payer: Self-pay | Admitting: Internal Medicine

## 2016-01-29 ENCOUNTER — Ambulatory Visit (INDEPENDENT_AMBULATORY_CARE_PROVIDER_SITE_OTHER): Payer: BLUE CROSS/BLUE SHIELD | Admitting: Internal Medicine

## 2016-01-29 DIAGNOSIS — Z0000 Encounter for general adult medical examination without abnormal findings: Secondary | ICD-10-CM

## 2016-01-29 DIAGNOSIS — K219 Gastro-esophageal reflux disease without esophagitis: Secondary | ICD-10-CM

## 2016-01-29 NOTE — Assessment & Plan Note (Signed)
Okay without meds Discussed intermittent omeprazole if he is getting the throat symptoms

## 2016-01-29 NOTE — Progress Notes (Signed)
Pre visit review using our clinic review tool, if applicable. No additional management support is needed unless otherwise documented below in the visit note. 

## 2016-01-29 NOTE — Progress Notes (Signed)
Subjective:    Patient ID: Thomas Villarreal, male    DOB: January 13, 1976, 40 y.o.   MRN: [id number]  HPI Here for physical  Planning to have hernia fixed at some time in future Looking into his schedule  Still gets some of the chest/reflux symptoms Not taking any medications now Does feel sensation in throat when lying down at times  Gets left arm at times ?related to pain in neck at times Occasionally tingling into arm No weakness though Discussed positional nerve issues  No current outpatient prescriptions on file prior to visit.   No current facility-administered medications on file prior to visit.     Allergies  Allergen Reactions  . Adhesive [Tape] Rash    Past Medical History:  Diagnosis Date  . Allergic rhinitis   . Chronic kidney disease    kidney stones  . GERD (gastroesophageal reflux disease)     Past Surgical History:  Procedure Laterality Date  . CHOLECYSTECTOMY  4/16    Family History  Problem Relation Age of Onset  . Hyperlipidemia Father   . Healthy Mother   . Kidney disease Maternal Grandfather   . Colon cancer Neg Hx   . Esophageal cancer Neg Hx   . Rectal cancer Neg Hx   . Stomach cancer Neg Hx     Social History   Social History  . Marital status: Married    Spouse name: N/A  . Number of children: 1  . Years of education: N/A   Occupational History  . Pizza Maker/Manager Other    [employer]   Social History Main Topics  . Smoking status: Former Smoker    Packs/day: 1.00    Years: 15.00    Types: Cigarettes    Quit date: 01/23/2004  . Smokeless tobacco: Never Used  . Alcohol use Yes     Comment: Occasional  . Drug use: No  . Sexual activity: Not on file   Other Topics Concern  . Not on file   Social History Narrative   Married; 1 daughter      Brett Albino maker/manager-[employer]      No regular exercise      Caffeine: 3/day         Review of Systems  Constitutional: Negative for fatigue and unexpected weight change.    Wears seat belt  HENT: Negative for dental problem, hearing loss, tinnitus and trouble swallowing.        Keeps up with dentist  Eyes: Negative for visual disturbance.       No diplopia or unilateral vision loss  Respiratory: Negative for cough, chest tightness and shortness of breath.   Cardiovascular: Negative for chest pain, palpitations and leg swelling.  Gastrointestinal: Negative for abdominal pain, nausea and vomiting.  Endocrine: Negative for polydipsia and polyuria.  Genitourinary: Negative for difficulty urinating and urgency.       No sexual problems  Musculoskeletal: Positive for arthralgias. Negative for back pain and joint swelling.       Variable mild pains  Skin: Negative for rash.       No suspicious lesions--does see derm  Allergic/Immunologic: Positive for environmental allergies. Negative for immunocompromised state.       Uses flonase when things act up  Neurological: Negative for dizziness, syncope, light-headedness and headaches.  Hematological: Negative for adenopathy. Does not bruise/bleed easily.  Psychiatric/Behavioral: Negative for dysphoric mood and sleep disturbance. The patient is not nervous/anxious.        Typical life stress only  Objective:   Physical Exam  Constitutional: He is oriented to person, place, and time. He appears well-developed and well-nourished. No distress.  HENT:  Head: Normocephalic and atraumatic.  Right Ear: External ear normal.  Left Ear: External ear normal.  Mouth/Throat: Oropharynx is clear and moist. No oropharyngeal exudate.  Eyes: Conjunctivae are normal. Pupils are equal, round, and reactive to light.  Neck: Normal range of motion. Neck supple. No thyromegaly present.  Cardiovascular: Normal rate, regular rhythm, normal heart sounds and intact distal pulses.  Exam reveals no gallop.   No murmur heard. Pulmonary/Chest: Effort normal and breath sounds normal. No respiratory distress. He has no wheezes. He has no  rales.  Abdominal: Soft. There is no tenderness.  Musculoskeletal: He exhibits no edema or tenderness.  Lymphadenopathy:    He has no cervical adenopathy.  Neurological: He is alert and oriented to person, place, and time.  Skin: No rash noted. No erythema.  Psychiatric: He has a normal mood and affect. His behavior is normal.          Assessment & Plan:

## 2016-01-29 NOTE — Assessment & Plan Note (Signed)
Healthy Discussed fitness and healthy eating No cancer screening yet Yearly flu shot

## 2016-01-29 NOTE — Patient Instructions (Signed)
If you have ongoing throat symptoms at night (from acid), you should take the omeprazole for 1-2 weeks to calm things down.

## 2016-06-27 HISTORY — PX: INGUINAL HERNIA REPAIR: SUR1180

## 2016-08-29 ENCOUNTER — Telehealth: Payer: Self-pay | Admitting: Internal Medicine

## 2016-08-29 ENCOUNTER — Ambulatory Visit (INDEPENDENT_AMBULATORY_CARE_PROVIDER_SITE_OTHER): Payer: BLUE CROSS/BLUE SHIELD | Admitting: Family Medicine

## 2016-08-29 ENCOUNTER — Encounter: Payer: Self-pay | Admitting: Family Medicine

## 2016-08-29 DIAGNOSIS — M898X1 Other specified disorders of bone, shoulder: Secondary | ICD-10-CM

## 2016-08-29 DIAGNOSIS — M25512 Pain in left shoulder: Secondary | ICD-10-CM | POA: Diagnosis not present

## 2016-08-29 MED ORDER — CYCLOBENZAPRINE HCL 10 MG PO TABS: 10.0000 mg | ORAL_TABLET | Freq: Three times a day (TID) | ORAL | 0 refills | Status: DC | PRN

## 2016-08-29 MED ORDER — PREDNISONE 10 MG (48) PO TBPK: ORAL_TABLET | ORAL | 0 refills | Status: DC

## 2016-08-29 NOTE — Patient Instructions (Addendum)
Take the medications as prescribed.  Heat as needed.  Follow up if you fail to improve.  Take care  Dr. Adriana Simasook

## 2016-08-29 NOTE — Progress Notes (Signed)
Pre visit review using our clinic review tool, if applicable. No additional management support is needed unless otherwise documented below in the visit note. 

## 2016-08-29 NOTE — Telephone Encounter (Signed)
Pt has appt 08/29/16 at 3PM with Dr Everlene OtherJayce Cook.
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
## 2016-08-30 DIAGNOSIS — M898X1 Other specified disorders of bone, shoulder: Secondary | ICD-10-CM | POA: Insufficient documentation

## 2016-08-30 DIAGNOSIS — M25512 Pain in left shoulder: Secondary | ICD-10-CM | POA: Insufficient documentation

## 2016-08-30 NOTE — Assessment & Plan Note (Signed)
New problem. Concern for rotator cuff pathology given exam.  Prednisone taper.
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
## 2016-08-30 NOTE — Assessment & Plan Note (Signed)
New problem. Due to spasm. Treating with Flexeril.

## 2017-02-01 ENCOUNTER — Encounter: Payer: BLUE CROSS/BLUE SHIELD | Admitting: Internal Medicine

## 2017-02-24 ENCOUNTER — Encounter: Payer: Self-pay | Admitting: Internal Medicine

## 2017-02-24 ENCOUNTER — Ambulatory Visit (INDEPENDENT_AMBULATORY_CARE_PROVIDER_SITE_OTHER): Payer: BLUE CROSS/BLUE SHIELD | Admitting: Internal Medicine

## 2017-02-24 VITALS — BP 124/80 | HR 68 | Temp 97.6°F | Wt 176.0 lb

## 2017-02-24 DIAGNOSIS — K219 Gastro-esophageal reflux disease without esophagitis: Secondary | ICD-10-CM

## 2017-02-24 DIAGNOSIS — Z0000 Encounter for general adult medical examination without abnormal findings: Secondary | ICD-10-CM | POA: Diagnosis not present

## 2017-02-24 LAB — CBC WITH DIFFERENTIAL/PLATELET
BASOS ABS: 0.1 10*3/uL (ref 0.0–0.1)
Basophils Relative: 1 % (ref 0.0–3.0)
Eosinophils Absolute: 0.1 10*3/uL (ref 0.0–0.7)
Eosinophils Relative: 1.1 % (ref 0.0–5.0)
HEMATOCRIT: 47.3 % (ref 39.0–52.0)
HEMOGLOBIN: 16.2 g/dL (ref 13.0–17.0)
LYMPHS PCT: 30.5 % (ref 12.0–46.0)
Lymphs Abs: 1.6 10*3/uL (ref 0.7–4.0)
MCHC: 34.3 g/dL (ref 30.0–36.0)
MCV: 88.2 fl (ref 78.0–100.0)
Monocytes Absolute: 0.5 10*3/uL (ref 0.1–1.0)
Monocytes Relative: 9 % (ref 3.0–12.0)
Neutro Abs: 3.1 10*3/uL (ref 1.4–7.7)
Neutrophils Relative %: 58.4 % (ref 43.0–77.0)
Platelets: 194 10*3/uL (ref 150.0–400.0)
RBC: 5.36 Mil/uL (ref 4.22–5.81)
RDW: 13.5 % (ref 11.5–15.5)
WBC: 5.3 10*3/uL (ref 4.0–10.5)

## 2017-02-24 LAB — LIPID PANEL
CHOL/HDL RATIO: 3
Cholesterol: 196 mg/dL (ref 0–200)
HDL: 66.3 mg/dL (ref >39.00)
LDL Cholesterol: 123 mg/dL — ABNORMAL HIGH (ref 0–99)
NonHDL: 129.33
TRIGLYCERIDES: 33 mg/dL (ref 0.0–149.0)
VLDL: 6.6 mg/dL (ref 0.0–40.0)

## 2017-02-24 LAB — COMPREHENSIVE METABOLIC PANEL
ALT: 23 U/L (ref 0–53)
AST: 16 U/L (ref 0–37)
Albumin: 4.6 g/dL (ref 3.5–5.2)
Alkaline Phosphatase: 47 U/L (ref 39–117)
BILIRUBIN TOTAL: 0.5 mg/dL (ref 0.2–1.2)
BUN: 13 mg/dL (ref 6–23)
CALCIUM: 9.8 mg/dL (ref 8.4–10.5)
CHLORIDE: 102 meq/L (ref 96–112)
CO2: 29 mEq/L (ref 19–32)
Creatinine, Ser: 1.1 mg/dL (ref 0.40–1.50)
GFR: 77.85 mL/min (ref >60.00)
Glucose, Bld: 96 mg/dL (ref 70–99)
Potassium: 4.5 mEq/L (ref 3.5–5.1)
Sodium: 136 mEq/L (ref 135–145)
Total Protein: 7.2 g/dL (ref 6.0–8.3)

## 2017-02-24 NOTE — Assessment & Plan Note (Signed)
Only mild occasional symptoms Will check labs

## 2017-02-24 NOTE — Assessment & Plan Note (Signed)
Healthy Discussed fitness Recommended flu vaccine--he is not excited about this Will check lipid

## 2017-02-24 NOTE — Progress Notes (Signed)
Subjective:    Patient ID: Thomas CobiaSalvatore Kanode, male    DOB: Sep 28, 1975, 41 y.o.   MRN: [id number]  HPI Here for physical  Had LIH done Has been fine with this  Still gets some acid reflux Depends on what he eats No dysphagia  Both hands will get numb at times Mostly positional--if leaning on elbow a certain way Goes away fairly quickly No night symptoms No hand weakness--but will have some rare pain in kitchen (like holding a spoon tight)  Wife noticed some swelling in right cheek ??allergies No pain No Rx --but used flonase with success in past  No current outpatient prescriptions on file prior to visit.   No current facility-administered medications on file prior to visit.     Allergies  Allergen Reactions  . Adhesive [Tape] Rash    Past Medical History:  Diagnosis Date  . Allergic rhinitis   . Chronic kidney disease    kidney stones  . GERD (gastroesophageal reflux disease)     Past Surgical History:  Procedure Laterality Date  . CHOLECYSTECTOMY  4/16  . INGUINAL HERNIA REPAIR Left 06/2016    Family History  Problem Relation Age of Onset  . Hyperlipidemia Father   . Healthy Mother   . Kidney disease Maternal Grandfather   . Colon cancer Neg Hx   . Esophageal cancer Neg Hx   . Rectal cancer Neg Hx   . Stomach cancer Neg Hx     Social History   Social History  . Marital status: Married    Spouse name: N/A  . Number of children: 1  . Years of education: N/A   Occupational History  . Pizza Maker/Manager Other    [employer]   Social History Main Topics  . Smoking status: Former Smoker    Packs/day: 1.00    Years: 15.00    Types: Cigarettes    Quit date: 01/23/2004  . Smokeless tobacco: Never Used  . Alcohol use Yes     Comment: Occasional  . Drug use: No  . Sexual activity: Not on file   Other Topics Concern  . Not on file   Social History Narrative   Married; 1 daughter      Brett Albinoizza maker/manager-[employer]      No regular exercise        Caffeine: 3/day         Review of Systems  Constitutional:       Has slacked off on exercise---weight up slightly Wears seat belt  HENT: Positive for tinnitus. Negative for dental problem, hearing loss and trouble swallowing.        Keeps up with dentist  Eyes: Negative for visual disturbance.       No diplopia or unilateral vision loss LASIK--now needs reading glasses  Respiratory: Positive for chest tightness (from acid reflux). Negative for cough and shortness of breath.   Cardiovascular: Negative for chest pain, palpitations and leg swelling.  Gastrointestinal: Positive for constipation. Negative for abdominal pain and blood in stool.  Endocrine: Negative for polydipsia and polyuria.  Genitourinary: Negative for difficulty urinating and urgency.       No sexual problems  Musculoskeletal: Negative for arthralgias, back pain and joint swelling.  Skin: Negative for rash.  Allergic/Immunologic: Positive for environmental allergies. Negative for immunocompromised state.  Neurological: Positive for headaches. Negative for dizziness, syncope and light-headedness.  Hematological: Negative for adenopathy. Does not bruise/bleed easily.  Psychiatric/Behavioral: Negative for dysphoric mood and sleep disturbance. The patient is not nervous/anxious.  Objective:   Physical Exam  Constitutional: He is oriented to person, place, and time. He appears well-developed and well-nourished. No distress.  HENT:  Head: Normocephalic and atraumatic.  Right Ear: External ear normal.  Left Ear: External ear normal.  Mouth/Throat: Oropharynx is clear and moist. No oropharyngeal exudate.  Eyes: Pupils are equal, round, and reactive to light. Conjunctivae are normal.  Neck: Normal range of motion. No thyromegaly present.  Cardiovascular: Normal rate, regular rhythm, normal heart sounds and intact distal pulses.  Exam reveals no gallop.   No murmur heard. Pulmonary/Chest: Effort normal and breath  sounds normal. No respiratory distress. He has no wheezes. He has no rales.  Abdominal: Soft. There is no tenderness.  Musculoskeletal: He exhibits no edema or tenderness.  Lymphadenopathy:    He has no cervical adenopathy.  Neurological: He is alert and oriented to person, place, and time.  Skin: No rash noted. No erythema.  Psychiatric: He has a normal mood and affect. His behavior is normal.          Assessment & Plan:

## 2017-03-13 ENCOUNTER — Telehealth: Payer: Self-pay | Admitting: Internal Medicine

## 2017-03-13 NOTE — Telephone Encounter (Signed)
I sent his results on MyChart--find out if he still has access to his account. The labs were fine Send copy with my report if needed

## 2017-03-13 NOTE — Telephone Encounter (Signed)
Caller Name:Sal Harrower Relationship to Patient:self Best number:[phone] (first) and [phone] (2nd)  Pharmacy:  Reason for call:  Pt never got call about labs. Pt would like call

## 2017-03-14 NOTE — Telephone Encounter (Signed)
Spoke to pt and advised per Dr Alphonsus Sias. Pt states he forgot his password, but will reset for access

## 2017-07-28 ENCOUNTER — Ambulatory Visit: Payer: BLUE CROSS/BLUE SHIELD | Admitting: Internal Medicine

## 2018-03-09 ENCOUNTER — Ambulatory Visit (INDEPENDENT_AMBULATORY_CARE_PROVIDER_SITE_OTHER): Payer: BLUE CROSS/BLUE SHIELD | Admitting: Internal Medicine

## 2018-03-09 ENCOUNTER — Encounter: Payer: Self-pay | Admitting: Internal Medicine

## 2018-03-09 DIAGNOSIS — Z0000 Encounter for general adult medical examination without abnormal findings: Secondary | ICD-10-CM | POA: Diagnosis not present

## 2018-03-09 DIAGNOSIS — J301 Allergic rhinitis due to pollen: Secondary | ICD-10-CM | POA: Diagnosis not present

## 2018-03-09 NOTE — Progress Notes (Signed)
Subjective:    Patient ID: Thomas Villarreal, male    DOB: 07-21-1975, 42 y.o.   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   No current facility-administered medications on file prior to visit.     Allergies  Allergen Reactions  . Adhesive [Tape] Rash    Past Medical History:  Diagnosis Date  . Allergic rhinitis   . Chronic kidney disease    kidney stones  . GERD (gastroesophageal reflux disease)     Past Surgical History:  Procedure Laterality Date  . CHOLECYSTECTOMY  4/16  . INGUINAL HERNIA REPAIR Left 06/2016    Family History  Problem Relation Age of Onset  . Hyperlipidemia Father   . Healthy Mother   . Kidney disease Maternal Grandfather   . Colon cancer Neg Hx   . Esophageal cancer Neg Hx   . Rectal cancer Neg Hx   . Stomach cancer Neg Hx    Review of Systems  Constitutional:       Wears seat belt  HENT: Negative for dental problem, hearing loss and trouble swallowing.        Transient tinnitus Keeps up with dentist  Eyes: Negative for visual disturbance.       No diplopia or unilateral vision loss  Respiratory: Negative for cough, chest tightness and shortness of breath.   Cardiovascular: Negative for chest pain and leg swelling.       Will occasionally feel his heart when he lies down  Gastrointestinal: Negative for blood in stool and constipation.       Rare heartburn--hasn't needed meds  Endocrine: Negative for polydipsia and polyuria.  Genitourinary: Negative for difficulty urinating and urgency.       No sexual problems  Musculoskeletal: Negative for arthralgias and joint swelling.       Occ mild back stiffness  Skin: Negative for rash.  Allergic/Immunologic:  Positive for environmental allergies. Negative for immunocompromised state.  Neurological: Negative for dizziness, syncope and light-headedness.       Mild left hand numbness---transient (discussed positioning) Occasional mild allergy headaches  Hematological: Negative for adenopathy. Does not bruise/bleed easily.  Psychiatric/Behavioral: Negative for dysphoric mood and sleep disturbance. The patient is not nervous/anxious.        Sleeps 6 hours most nights       Objective:   Physical Exam  Constitutional: He is oriented to person, place, and time. He appears well-developed. No distress.  HENT:  Head: Normocephalic and atraumatic.  Right Ear: External ear normal.  Left Ear: External ear normal.  Mouth/Throat: Oropharynx is clear and moist. No oropharyngeal exudate.  Eyes: Pupils are equal, round, and reactive to light. Conjunctivae are normal.  Neck: No thyromegaly present.  Cardiovascular: Normal rate, regular rhythm, normal heart sounds and intact distal pulses. Exam reveals no gallop.  No murmur heard. Respiratory: Effort normal and breath sounds normal. No respiratory distress. He has no wheezes. He has no rales.  GI: Soft. There is no tenderness.  Musculoskeletal: He exhibits no edema or tenderness.  Lymphadenopathy:    He has no cervical adenopathy.  Neurological: He is alert and oriented to person, place, and time.  Skin: No rash noted. No erythema.  Psychiatric: He has a normal mood and affect. His  behavior is normal.           Assessment & Plan:

## 2018-03-09 NOTE — Assessment & Plan Note (Signed)
Healthy Discussed fitness Recommended flu vaccine--he will consider

## 2018-03-09 NOTE — Patient Instructions (Signed)
You can try cetirizine 10mg , fexofenadine 180mg  or loratadine 10-20mg  daily (or 2 of these at a time) You can also add the nasal spray (like fluticasone)

## 2018-03-09 NOTE — Assessment & Plan Note (Signed)
Discussed OTC Rx options

## 2019-03-19 ENCOUNTER — Ambulatory Visit (INDEPENDENT_AMBULATORY_CARE_PROVIDER_SITE_OTHER): Payer: BC Managed Care – PPO | Admitting: Internal Medicine

## 2019-03-19 ENCOUNTER — Encounter: Payer: Self-pay | Admitting: Internal Medicine

## 2019-03-19 ENCOUNTER — Other Ambulatory Visit: Payer: Self-pay

## 2019-03-19 DIAGNOSIS — J301 Allergic rhinitis due to pollen: Secondary | ICD-10-CM | POA: Diagnosis not present

## 2019-03-19 DIAGNOSIS — Z0000 Encounter for general adult medical examination without abnormal findings: Secondary | ICD-10-CM | POA: Diagnosis not present

## 2019-03-19 NOTE — Progress Notes (Signed)
Subjective:    Patient ID: Thomas Villarreal, male    DOB: 28-Nov-1975, 43 y.o.   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 cetirizine (ZYRTEC) 10 MG tablet Take 10 mg by mouth daily.     No current facility-administered medications on file prior to visit.     Allergies  Allergen Reactions  . Adhesive [Tape] Rash    Past Medical History:  Diagnosis Date  . Allergic rhinitis   . Chronic kidney disease    kidney stones  . GERD (gastroesophageal reflux disease)     Past Surgical History:  Procedure Laterality Date  . CHOLECYSTECTOMY  4/16  . INGUINAL HERNIA REPAIR Left 06/2016    Family History  Problem Relation Age of Onset  . Hyperlipidemia Father   . Healthy Mother   . Kidney disease Maternal Grandfather   . Colon cancer Neg Hx   . Esophageal cancer Neg Hx   . Rectal cancer Neg Hx   . Stomach cancer Neg Hx     Social History   Socioeconomic History  . Marital status: Married    Spouse name: Not on file  . Number of children: 1  . Years of education: Not on file  . Highest education level: Not on file  Occupational History  . 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 Financial resource strain: Not on file  . Food insecurity    Worry: Not on file    Inability: Not on file  . Transportation needs    Medical: Not on file    Non-medical: Not on file  Tobacco Use  .  Smoking status: Former Smoker    Packs/day: 1.00    Years: 15.00    Pack years: 15.00    Types: Cigarettes    Quit date: 01/23/2004    Years since quitting: 15.1  . Smokeless tobacco: Never Used  Substance and Sexual Activity  . Alcohol use: Yes    Comment: Occasional  . Drug use: No  . Sexual activity: Not on file  Lifestyle  . Physical activity    Days per week: Not on file    Minutes per session: Not on file  . Stress: Not on file  Relationships  . Social Musician on phone: Not on file    Gets together: Not on file    Attends religious service: Not on file    Active member of club or organization: Not on file    Attends meetings of clubs or organizations: Not on file    Relationship status: Not on file  . Intimate partner violence    Fear of current or ex partner: Not on file    Emotionally abused: Not on file    Physically abused: Not on file    Forced sexual activity: Not on file  Other Topics Concern  . Not on file  Social History Narrative  Married; 1 daughter         Review of Systems  Constitutional: Negative for fatigue and unexpected weight change.       Wears seat belt  HENT: Negative for dental problem, hearing loss and tinnitus.        Keeps up with dentist  Eyes: Negative for visual disturbance.       No diplopia or unilateral vision loss Not completely satisfied with LASIK result  Respiratory: Negative for cough, chest tightness and shortness of breath.   Cardiovascular: Negative for chest pain, palpitations and leg swelling.  Gastrointestinal: Positive for constipation. Negative for blood in stool.       No regular heartburn now---rare brief sensation (but no meds needed)  Endocrine: Negative for polydipsia and polyuria.  Genitourinary: Negative for difficulty urinating and urgency.       No sexual problems  Musculoskeletal: Negative for arthralgias, gait problem and joint swelling.  Skin: Negative for rash.       No suspicious  lesions  Allergic/Immunologic: Positive for environmental allergies. Negative for immunocompromised state.       Still gets symptoms despite the zyrtec  Neurological: Negative for dizziness, syncope, light-headedness and headaches.  Hematological: Negative for adenopathy. Does not bruise/bleed easily.  Psychiatric/Behavioral: Negative for dysphoric mood and sleep disturbance. The patient is not nervous/anxious.        Objective:   Physical Exam  Constitutional: He appears well-developed. No distress.  HENT:  Head: Normocephalic and atraumatic.  Right Ear: External ear normal.  Left Ear: External ear normal.  Mouth/Throat: Oropharynx is clear and moist. No oropharyngeal exudate.  Eyes: Pupils are equal, round, and reactive to light. Conjunctivae are normal.  Neck: No thyromegaly present.  Cardiovascular: Normal rate, regular rhythm, normal heart sounds and intact distal pulses. Exam reveals no gallop.  No murmur heard. Respiratory: Effort normal and breath sounds normal. No respiratory distress. He has no wheezes. He has no rales.  GI: Soft. There is no abdominal tenderness.  Musculoskeletal:        General: No tenderness or edema.  Lymphadenopathy:    He has no cervical adenopathy.  Skin: No rash noted. No erythema.  Psychiatric: He has a normal mood and affect. His behavior is normal.           Assessment & Plan:

## 2019-03-19 NOTE — Assessment & Plan Note (Signed)
Healthy Discussed better shoes for the foot pain Prefers no flu vaccine Discussed exercise

## 2019-03-19 NOTE — Assessment & Plan Note (Signed)
Discussed adding fluticasone nasal spray for increased symptoms

## 2019-03-19 NOTE — Patient Instructions (Signed)
Please try miralax 1 capful daily in a full glass of water and continue this. If this is not enough, you can add 1-2 senna-s tabs daily as well.

## 2019-09-01 ENCOUNTER — Ambulatory Visit: Payer: BC Managed Care – PPO | Attending: Internal Medicine

## 2019-09-01 DIAGNOSIS — Z23 Encounter for immunization: Secondary | ICD-10-CM

## 2019-09-01 NOTE — Progress Notes (Signed)
   Covid-19 Vaccination Clinic  Name:  Thomas Villarreal    MRN: [id number] DOB: 03-21-1976  09/01/2019  Mr. Thomas Villarreal was observed post Covid-19 immunization for 15 minutes without incident. He was provided with Vaccine Information Sheet and instruction to access the V-Safe system.   Mr. Thomas Villarreal was instructed to call 911 with any severe reactions post vaccine: Marland Kitchen Difficulty breathing  . Swelling of face and throat  . A fast heartbeat  . A bad rash all over body  . Dizziness and weakness   Immunizations Administered    Name Date Dose VIS Date Route   Pfizer COVID-19 Vaccine 09/01/2019  9:17 AM 0.3 mL 06/07/2019 Intramuscular   Manufacturer: [employer], [employer]   Lot: [id number]   NDC: [address]-0

## 2019-10-02 ENCOUNTER — Ambulatory Visit: Payer: BC Managed Care – PPO | Attending: Internal Medicine

## 2019-10-02 DIAGNOSIS — Z23 Encounter for immunization: Secondary | ICD-10-CM

## 2019-10-02 NOTE — Progress Notes (Signed)
   Covid-19 Vaccination Clinic  Name:  Chasyn Cinque    MRN: [id number] DOB: Aug 02, 1975  10/02/2019  Mr. Imran was observed post Covid-19 immunization for 15 minutes without incident. He was provided with Vaccine Information Sheet and instruction to access the V-Safe system.   Mr. Novosad was instructed to call 911 with any severe reactions post vaccine: Marland Kitchen Difficulty breathing  . Swelling of face and throat  . A fast heartbeat  . A bad rash all over body  . Dizziness and weakness   Immunizations Administered    Name Date Dose VIS Date Route   Pfizer COVID-19 Vaccine 10/02/2019  9:15 AM 0.3 mL 06/07/2019 Intramuscular   Manufacturer: [employer], [employer]   Lot: [id number]   NDC: [address]-1

## 2019-10-04 ENCOUNTER — Other Ambulatory Visit: Payer: Self-pay

## 2019-10-04 ENCOUNTER — Encounter: Payer: Self-pay | Admitting: Internal Medicine

## 2019-10-04 ENCOUNTER — Ambulatory Visit (INDEPENDENT_AMBULATORY_CARE_PROVIDER_SITE_OTHER): Payer: BC Managed Care – PPO | Admitting: Internal Medicine

## 2019-10-04 DIAGNOSIS — H538 Other visual disturbances: Secondary | ICD-10-CM | POA: Diagnosis not present

## 2019-10-04 LAB — CBC
HCT: 46.1 % (ref 39.0–52.0)
Hemoglobin: 15.8 g/dL (ref 13.0–17.0)
MCHC: 34.3 g/dL (ref 30.0–36.0)
MCV: 86.5 fl (ref 78.0–100.0)
Platelets: 200 10*3/uL (ref 150.0–400.0)
RBC: 5.33 Mil/uL (ref 4.22–5.81)
RDW: 13.9 % (ref 11.5–15.5)
WBC: 5.5 10*3/uL (ref 4.0–10.5)

## 2019-10-04 LAB — COMPREHENSIVE METABOLIC PANEL
ALT: 23 U/L (ref 0–53)
AST: 15 U/L (ref 0–37)
Albumin: 4.4 g/dL (ref 3.5–5.2)
Alkaline Phosphatase: 59 U/L (ref 39–117)
BUN: 16 mg/dL (ref 6–23)
CO2: 27 mEq/L (ref 19–32)
Calcium: 9.5 mg/dL (ref 8.4–10.5)
Chloride: 104 mEq/L (ref 96–112)
Creatinine, Ser: 1 mg/dL (ref 0.40–1.50)
GFR: 80.78 mL/min (ref >60.00)
Glucose, Bld: 92 mg/dL (ref 70–99)
Potassium: 4.5 mEq/L (ref 3.5–5.1)
Sodium: 138 mEq/L (ref 135–145)
Total Bilirubin: 0.6 mg/dL (ref 0.2–1.2)
Total Protein: 6.8 g/dL (ref 6.0–8.3)

## 2019-10-04 LAB — HEMOGLOBIN A1C: Hgb A1c MFr Bld: 5.3 % (ref 4.6–6.5)

## 2019-10-04 NOTE — Progress Notes (Signed)
Subjective:    Patient ID: Thomas Villarreal, male    DOB: July 23, 1975, 44 y.o.   MRN: [id number]  HPI Here due to vision issues This visit occurred during the SARS-CoV-2 public health emergency.  Safety protocols were in place, including screening questions prior to the visit, additional usage of staff PPE, and extensive cleaning of exam room while observing appropriate contact time as indicated for disinfecting solutions.   Has had some trouble with his vision "Not seeing well" Has cataract--will be going to see surgeon Brought up the issue of possible diabetes  His vision is blurry Trouble with road signs Reminds him of what it was like when he would take his glasses off--before he had the LASIK  Left is worse than right  Current Outpatient Medications on File Prior to Visit  Medication Sig Dispense Refill  . cetirizine (ZYRTEC) 10 MG tablet Take 10 mg by mouth daily.    Marland Kitchen ketoconazole (NIZORAL) 2 % shampoo SHAMPOO TWO TIMES WEEKLY. LEAVE ON 5 MINUTES THEN RINSE WELL.    . fluocinonide (LIDEX) 0.05 % external solution APPLY TO AFFECTED AREAS OF SCALP DAILY UNTIL CLEAR, THEN FOR FLARES    . hydrocortisone 2.5 % cream APPLY TO FACE TWICE DAILY AS NEEDED FOR FLARES.     No current facility-administered medications on file prior to visit.    Allergies  Allergen Reactions  . Adhesive [Tape] Rash    Past Medical History:  Diagnosis Date  . Allergic rhinitis   . Chronic kidney disease    kidney stones  . GERD (gastroesophageal reflux disease)     Past Surgical History:  Procedure Laterality Date  . CHOLECYSTECTOMY  4/16  . INGUINAL HERNIA REPAIR Left 06/2016    Family History  Problem Relation Age of Onset  . Hyperlipidemia Father   . Healthy Mother   . Kidney disease Maternal Grandfather   . Colon cancer Neg Hx   . Esophageal cancer Neg Hx   . Rectal cancer Neg Hx   . Stomach cancer Neg Hx     Social History   Socioeconomic History  . Marital status: Married     Spouse name: Not on file  . Number of children: 1  . Years of education: Not on file  . Highest education level: Not on file  Occupational History  . 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 Smoking status: Former Smoker    Packs/day: 1.00    Years: 15.00    Pack years: 15.00    Types: Cigarettes    Quit date: 01/23/2004    Years since quitting: 15.7  . Smokeless tobacco: Never Used  Substance and Sexual Activity  . Alcohol use: Yes    Comment: Occasional  . Drug use: No  . Sexual activity: Not on file  Other Topics Concern  . Not on file  Social History Narrative   Married; 1 daughter         Social Determinants of Health   Financial Resource Strain:   . Difficulty of Paying Living Expenses:   Food Insecurity:   . Worried About Charity fundraiser in the Last Year:   . Arboriculturist in the Last Year:   Transportation Needs:   . Film/video editor (Medical):   Marland Kitchen Lack of Transportation (Non-Medical):   Physical Activity:   . Days of Exercise per Week:   . Minutes of Exercise per Session:   Stress:   . Feeling of  Stress :   Social Connections:   . Frequency of Communication with Friends and Family:   . Frequency of Social Gatherings with Friends and Family:   . Attends Religious Services:   . Active Member of Clubs or Organizations:   . Attends Banker Meetings:   Marland Kitchen Marital Status:   Intimate Partner Violence:   . Fear of Current or Ex-Partner:   . Emotionally Abused:   Marland Kitchen Physically Abused:   . Sexually Abused:    Review of Systems  Weight is stable No recent illness No increased thirst or urination No weakness or sensory changes     Objective:   Physical Exam  Eyes: Conjunctivae are normal.  Appears to have cataracts L>R Fundi look okay  Musculoskeletal:     Comments: Synovial cyst on left wrist (mobile, small and non tender) Discussed--no action needed           Assessment & Plan:

## 2019-10-04 NOTE — Assessment & Plan Note (Signed)
Is going to surgeon Concern for cataract Will check labs---doubt diabetes

## 2020-03-24 ENCOUNTER — Ambulatory Visit (INDEPENDENT_AMBULATORY_CARE_PROVIDER_SITE_OTHER): Payer: BC Managed Care – PPO | Admitting: Internal Medicine

## 2020-03-24 ENCOUNTER — Other Ambulatory Visit: Payer: Self-pay

## 2020-03-24 ENCOUNTER — Encounter: Payer: Self-pay | Admitting: Internal Medicine

## 2020-03-24 VITALS — BP 108/72 | HR 62 | Temp 98.0°F | Ht 67.5 in | Wt 174.0 lb

## 2020-03-24 DIAGNOSIS — J301 Allergic rhinitis due to pollen: Secondary | ICD-10-CM

## 2020-03-24 DIAGNOSIS — Z0000 Encounter for general adult medical examination without abnormal findings: Secondary | ICD-10-CM

## 2020-03-24 DIAGNOSIS — K219 Gastro-esophageal reflux disease without esophagitis: Secondary | ICD-10-CM

## 2020-03-24 DIAGNOSIS — Z1211 Encounter for screening for malignant neoplasm of colon: Secondary | ICD-10-CM

## 2020-03-24 NOTE — Progress Notes (Signed)
Subjective:    Patient ID: Thomas Villarreal, male    DOB: 02/02/1976, 44 y.o.   MRN: [id number]  HPI Here for physical This visit occurred during the SARS-CoV-2 public health emergency.  Safety protocols were in place, including screening questions prior to the visit, additional usage of staff PPE, and extensive cleaning of exam room while observing appropriate contact time as indicated for disinfecting solutions.   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 cetirizine (ZYRTEC) 10 MG tablet Take 10 mg by mouth daily.    . fluocinonide (LIDEX) 0.05 % external solution APPLY TO AFFECTED AREAS OF SCALP DAILY UNTIL CLEAR, THEN FOR FLARES    . hydrocortisone 2.5 % cream APPLY TO FACE TWICE DAILY AS NEEDED FOR FLARES.    Marland Kitchen ketoconazole (NIZORAL) 2 % shampoo SHAMPOO TWO TIMES WEEKLY. LEAVE ON 5 MINUTES THEN RINSE WELL.     No current facility-administered medications on file prior to visit.    Allergies  Allergen Reactions  . Adhesive [Tape] Rash    Past Medical History:  Diagnosis Date  . Allergic rhinitis   . Chronic kidney disease    kidney stones  . GERD (gastroesophageal reflux disease)     Past Surgical History:  Procedure Laterality Date  . CATARACT EXTRACTION W/ INTRAOCULAR LENS IMPLANT Bilateral   . CHOLECYSTECTOMY  4/16  . INGUINAL HERNIA REPAIR Left 06/2016    Family History  Problem Relation Age of Onset  . Hyperlipidemia Father   . Healthy Mother   . Kidney disease Maternal Grandfather   . Colon cancer Neg Hx   . Esophageal cancer Neg Hx   . Rectal cancer  Neg Hx   . Stomach cancer Neg Hx     Social History   Socioeconomic History  . Marital status: Married    Spouse name: Not on file  . Number of children: 1  . Years of education: Not on file  . Highest education level: Not on file  Occupational History  . 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 Smoking status: Former Smoker    Packs/day: 1.00    Years: 15.00    Pack years: 15.00    Types: Cigarettes    Quit date: 01/23/2004    Years since quitting: 16.1  . Smokeless tobacco: Never Used  Substance and Sexual Activity  . Alcohol use: Yes    Comment: Occasional  . Drug use: No  . Sexual activity: Not on file  Other Topics Concern  . Not on file  Social History Narrative   Married; 1 daughter         Social Determinants of Health   Financial Resource Strain:   . Difficulty of Paying Living Expenses: Not on file  Food Insecurity:   . Worried About Programme researcher, broadcasting/film/video in the Last Year: Not on file  . Ran Out of Food in the Last Year: Not on file  Transportation Needs:   . Lack of Transportation (Medical): Not on file  . Lack of Transportation (Non-Medical): Not on file  Physical Activity:   . Days of  Exercise per Week: Not on file  . Minutes of Exercise per Session: Not on file  Stress:   . Feeling of Stress : Not on file  Social Connections:   . Frequency of Communication with Friends and Family: Not on file  . Frequency of Social Gatherings with Friends and Family: Not on file  . Attends Religious Services: Not on file  . Active Member of Clubs or Organizations: Not on file  . Attends Banker Meetings: Not on file  . Marital Status: Not on file  Intimate Partner Violence:   . Fear of Current or Ex-Partner: Not on file  . Emotionally Abused: Not on file  . Physically Abused: Not on file  . Sexually Abused: Not on file   Review of Systems  Constitutional: Negative for fatigue and unexpected weight change.       Wears seat  belt  HENT: Negative for dental problem, hearing loss and trouble swallowing.        Brief tinnitus at times Overdue for dentist  Eyes: Negative for visual disturbance.  Respiratory: Negative for cough, chest tightness and shortness of breath.   Cardiovascular: Negative for chest pain, palpitations and leg swelling.  Gastrointestinal: Positive for constipation. Negative for blood in stool.       Uses miralax at times   Endocrine: Negative for polydipsia and polyuria.  Genitourinary: Negative for difficulty urinating and urgency.       No sexual problems  Musculoskeletal: Negative for arthralgias, back pain and joint swelling.       Occ leg stiffness in the morning  Skin:       Does keep up with dermatologist  Allergic/Immunologic: Positive for environmental allergies. Negative for immunocompromised state.  Neurological: Negative for dizziness, syncope, light-headedness and headaches.  Hematological: Negative for adenopathy. Does not bruise/bleed easily.  Psychiatric/Behavioral: Negative for dysphoric mood. The patient is not nervous/anxious.        Light sleeper and often not that great--a lot on his mind. Does have occasional great night--and then he feels good       Objective:   Physical Exam Constitutional:      Appearance: Normal appearance.  HENT:     Right Ear: Tympanic membrane, ear canal and external ear normal.     Left Ear: Tympanic membrane, ear canal and external ear normal.     Mouth/Throat:     Pharynx: No oropharyngeal exudate or posterior oropharyngeal erythema.  Eyes:     Conjunctiva/sclera: Conjunctivae normal.     Pupils: Pupils are equal, round, and reactive to light.  Cardiovascular:     Rate and Rhythm: Normal rate and regular rhythm.     Pulses: Normal pulses.     Heart sounds: No murmur heard.  No gallop.   Pulmonary:     Effort: Pulmonary effort is normal.     Breath sounds: Normal breath sounds. No wheezing or rales.  Abdominal:     Palpations:  Abdomen is soft.     Tenderness: There is no abdominal tenderness.  Musculoskeletal:        General: No swelling.     Cervical back: Neck supple.     Right lower leg: No edema.     Left lower leg: No edema.  Lymphadenopathy:     Cervical: No cervical adenopathy.  Skin:    General: Skin is warm.     Findings: No rash.  Neurological:     General: No focal deficit present.     Mental  Status: He is alert and oriented to person, place, and time.  Psychiatric:        Mood and Affect: Mood normal.        Behavior: Behavior normal.            Assessment & Plan:

## 2020-03-24 NOTE — Assessment & Plan Note (Signed)
Fine with OTC meds 

## 2020-03-24 NOTE — Assessment & Plan Note (Signed)
Healthy Discussed fitness---hard with his work schedule Recommended flu vaccine--he will consider (not excited about this) Needs colon cancer screening--will do FIT

## 2020-03-24 NOTE — Assessment & Plan Note (Signed)
Has been better lately No medications now

## 2020-04-02 ENCOUNTER — Other Ambulatory Visit (INDEPENDENT_AMBULATORY_CARE_PROVIDER_SITE_OTHER): Payer: BC Managed Care – PPO

## 2020-04-02 DIAGNOSIS — Z1211 Encounter for screening for malignant neoplasm of colon: Secondary | ICD-10-CM

## 2020-04-02 LAB — FECAL OCCULT BLOOD, IMMUNOCHEMICAL: Fecal Occult Bld: NEGATIVE

## 2021-03-24 ENCOUNTER — Encounter: Payer: Self-pay | Admitting: Internal Medicine

## 2021-03-26 ENCOUNTER — Other Ambulatory Visit: Payer: Self-pay

## 2021-03-26 ENCOUNTER — Ambulatory Visit (INDEPENDENT_AMBULATORY_CARE_PROVIDER_SITE_OTHER): Payer: BC Managed Care – PPO | Admitting: Internal Medicine

## 2021-03-26 ENCOUNTER — Encounter: Payer: Self-pay | Admitting: Internal Medicine

## 2021-03-26 VITALS — BP 120/84 | HR 70 | Temp 97.5°F | Ht 67.5 in | Wt 175.0 lb

## 2021-03-26 DIAGNOSIS — Z1211 Encounter for screening for malignant neoplasm of colon: Secondary | ICD-10-CM

## 2021-03-26 DIAGNOSIS — Z0000 Encounter for general adult medical examination without abnormal findings: Secondary | ICD-10-CM | POA: Diagnosis not present

## 2021-03-26 DIAGNOSIS — J301 Allergic rhinitis due to pollen: Secondary | ICD-10-CM | POA: Diagnosis not present

## 2021-03-26 DIAGNOSIS — K219 Gastro-esophageal reflux disease without esophagitis: Secondary | ICD-10-CM | POA: Diagnosis not present

## 2021-03-26 NOTE — Assessment & Plan Note (Signed)
Healthy Discussed exercise Will set up colonoscopy Bivalent COVID at pharmacy---hopefully he will get flu vaccine also

## 2021-03-26 NOTE — Assessment & Plan Note (Signed)
Needs to get back on the cetirizine--probably causing the throat symptoms

## 2021-03-26 NOTE — Progress Notes (Signed)
Subjective:    Patient ID: Thomas Villarreal, male    DOB: January 22, 1976, 45 y.o.   MRN: [id number]  HPI Here for physical This visit occurred during the SARS-CoV-2 public health emergency.  Safety protocols were in place, including screening questions prior to the visit, additional usage of staff PPE, and extensive cleaning of exam room while observing appropriate contact time as indicated for disinfecting solutions.   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     ketoconazole (NIZORAL) 2 % shampoo SHAMPOO TWO TIMES WEEKLY. LEAVE ON 5 MINUTES THEN RINSE WELL.     fluocinonide (LIDEX) 0.05 % external solution APPLY TO AFFECTED AREAS OF SCALP DAILY UNTIL CLEAR, THEN FOR FLARES (Patient not taking: Reported on 03/26/2021)     hydrocortisone 2.5 % cream APPLY TO FACE TWICE DAILY AS NEEDED FOR FLARES. (Patient not taking: Reported on 03/26/2021)     No current facility-administered medications on file prior to visit.    Allergies  Allergen Reactions   Adhesive [Tape] Rash    Past Medical History:  Diagnosis Date   Allergic rhinitis    Chronic kidney disease    kidney stones   GERD (gastroesophageal reflux disease)     Past Surgical History:  Procedure Laterality Date   CATARACT EXTRACTION W/ INTRAOCULAR LENS IMPLANT Bilateral    CHOLECYSTECTOMY  4/16   INGUINAL HERNIA REPAIR Left 06/2016    Family History  Problem Relation Age of Onset   Hyperlipidemia Father    Healthy Mother    Kidney disease Maternal Grandfather    Colon cancer Neg Hx    Esophageal cancer Neg Hx    Rectal cancer Neg Hx    Stomach cancer Neg Hx     Social History   Socioeconomic History   Marital status:  Married    Spouse name: Not on file   Number of children: 1   Years of education: Not on file   Highest education level: Not on file  Occupational History   Occupation: Owner    Comment: [employer]  Tobacco Use   Smoking status: Former    Packs/day: 1.00    Years: 15.00    Pack years: 15.00    Types: Cigarettes    Quit date: 01/23/2004    Years since quitting: 17.1   Smokeless tobacco: Never  Substance and Sexual Activity   Alcohol use: Yes    Comment: Occasional   Drug use: No   Sexual activity: Not on file  Other Topics Concern   Not on file  Social History Narrative   Married; 1 daughter         Social Determinants of Health   Financial Resource Strain: Not on file  Food Insecurity: Not on file  Transportation Needs: Not on file  Physical Activity: Not on file  Stress: Not on file  Social Connections: Not on file  Intimate Partner Violence: Not on file   Review of Systems  Constitutional:  Negative for fatigue and unexpected weight change.       Not really exercising Wears seat belt  HENT:  Positive for tinnitus. Negative for dental problem, hearing loss and trouble swallowing.  Slight sore throat this morning--better now Keeps up with dentist  Eyes:        Still with some vision issues---does okay with reading glasses  Respiratory:  Negative for cough, chest tightness and shortness of breath.   Cardiovascular:  Negative for chest pain, palpitations and leg swelling.  Gastrointestinal:  Positive for constipation. Negative for blood in stool.       No meds for bowels No blood Some reflux depending on what he eats---no meds  Endocrine: Negative for polydipsia and polyuria.  Genitourinary:        Will rarely feel he has to push to empty bladder No sexual problems  Musculoskeletal:  Negative for back pain and joint swelling.       Occasional mild hand or foot pain---transient  Skin:        Just saw derm--cream for elbows and shampoo   Allergic/Immunologic: Positive for environmental allergies. Negative for immunocompromised state.       Uses the zyrtec prn  Neurological:  Negative for dizziness, syncope, light-headedness and headaches.  Hematological:  Negative for adenopathy. Does not bruise/bleed easily.  Psychiatric/Behavioral:  Negative for dysphoric mood and sleep disturbance. The patient is not nervous/anxious.       Objective:   Physical Exam Constitutional:      Appearance: He is well-developed.  HENT:     Right Ear: Tympanic membrane and ear canal normal.     Left Ear: Tympanic membrane and ear canal normal.     Mouth/Throat:     Pharynx: No oropharyngeal exudate or posterior oropharyngeal erythema.  Eyes:     Conjunctiva/sclera: Conjunctivae normal.     Comments: Left pupil more dilated  Cardiovascular:     Rate and Rhythm: Normal rate and regular rhythm.     Pulses: Normal pulses.     Heart sounds: No murmur heard.   No gallop.  Pulmonary:     Effort: Pulmonary effort is normal.     Breath sounds: Normal breath sounds. No wheezing or rales.  Abdominal:     Palpations: Abdomen is soft.     Tenderness: There is no abdominal tenderness.  Musculoskeletal:     Cervical back: Neck supple.     Right lower leg: No edema.     Left lower leg: No edema.  Lymphadenopathy:     Cervical: No cervical adenopathy.  Skin:    Findings: No rash.  Neurological:     General: No focal deficit present.     Mental Status: He is alert and oriented to person, place, and time.  Psychiatric:        Mood and Affect: Mood normal.        Behavior: Behavior normal.           Assessment & Plan:

## 2021-03-26 NOTE — Assessment & Plan Note (Signed)
Fairly quiet Discussed meds if persistent symptoms

## 2021-03-29 ENCOUNTER — Telehealth: Payer: Self-pay

## 2021-03-29 NOTE — Telephone Encounter (Signed)
CALLED PATIENT LEFT MESSAGE FOR A CALL BACK

## 2021-08-15 ENCOUNTER — Ambulatory Visit: Admit: 2021-08-15 | Discharge status: Against Medical Advice | Payer: Self-pay

## 2022-02-28 ENCOUNTER — Emergency Department
Admission: EM | Admit: 2022-02-28 | Discharge: 2022-03-01 | Disposition: A | Discharge status: Home / Self Care | Payer: BC Managed Care – PPO | Attending: Student in an Organized Health Care Education/Training Program | Admitting: Student in an Organized Health Care Education/Training Program

## 2022-02-28 ENCOUNTER — Emergency Department: Payer: BC Managed Care – PPO

## 2022-02-28 ENCOUNTER — Other Ambulatory Visit: Payer: Self-pay

## 2022-02-28 DIAGNOSIS — Y9389 Activity, other specified: Secondary | ICD-10-CM | POA: Diagnosis not present

## 2022-02-28 DIAGNOSIS — R600 Localized edema: Secondary | ICD-10-CM | POA: Diagnosis not present

## 2022-02-28 DIAGNOSIS — X509XXA Other and unspecified overexertion or strenuous movements or postures, initial encounter: Secondary | ICD-10-CM | POA: Insufficient documentation

## 2022-02-28 DIAGNOSIS — S86911A Strain of unspecified muscle(s) and tendon(s) at lower leg level, right leg, initial encounter: Secondary | ICD-10-CM | POA: Insufficient documentation

## 2022-02-28 DIAGNOSIS — M79604 Pain in right leg: Secondary | ICD-10-CM | POA: Diagnosis present

## 2022-02-28 DIAGNOSIS — S86811A Strain of other muscle(s) and tendon(s) at lower leg level, right leg, initial encounter: Secondary | ICD-10-CM

## 2022-02-28 MED ORDER — MELOXICAM 15 MG PO TABS: 15.0000 mg | ORAL_TABLET | Freq: Every day | ORAL | 0 refills | Status: DC

## 2022-02-28 NOTE — ED Triage Notes (Signed)
Swelling and pain to right calf today. Pain worsens when flexing foot.  Pt had tightness to same area last week, but sx resolved.  +CMS intact

## 2022-02-28 NOTE — ED Provider Notes (Signed)
[facility] Provider Note  Patient Contact: 9:34 PM (approximate)   History   Leg Pain   HPI  Thomas Villarreal is a 46 y.o. male who presents to the emergency department complaining of calf pain.  Patient states he was playing soccer with his kids 3 days ago when he felt a pull in his calf.  He states that this is happened before, typically resolves without any treatment.  He started to notice some edema in his calf and was concerned after family mention blood clots that he could have developed a clot.  It is primarily along the right lateral calf region.  No overlying skin changes.  No other complaints.     Physical Exam   Triage Vital Signs: ED Triage Vitals  Enc Vitals Group     BP 02/28/22 2127 (!) 162/73     Pulse Rate 02/28/22 2127 88     Resp 02/28/22 2127 18     Temp 02/28/22 2127 97.9 F (36.6 C)     Temp Source 02/28/22 2127 Oral     SpO2 02/28/22 2127 99 %     Weight 02/28/22 2128 175 lb (79.4 kg)     Height 02/28/22 2128 5\' 8"  (1.727 m)     Head Circumference --      Peak Flow --      Pain Score 02/28/22 2127 3     Pain Loc --      Pain Edu? --      Excl. in GC? --     Most recent vital signs: Vitals:   02/28/22 2127  BP: (!) 162/73  Pulse: 88  Resp: 18  Temp: 97.9 F (36.6 C)  SpO2: 99%     General: Alert and in no acute distress.  Cardiovascular:  Good peripheral perfusion Respiratory: Normal respiratory effort without tachypnea or retractions. Lungs CTAB.  Musculoskeletal: Full range of motion to all extremities.  Visualization of the right calf reveals no gross erythema.  There is some edema of the right calf compared to left.  Tenderness along the right lateral calf region.  No palpable cords or lesion.  Pulses sensation intact distally. Neurologic:  No gross focal neurologic deficits are appreciated.  Skin:   No rash noted Other:   ED Results / Procedures / Treatments   Labs (all labs ordered are listed, but  only abnormal results are displayed) Labs Reviewed - No data to display   EKG     RADIOLOGY  I personally viewed, evaluated, and interpreted these images as part of my medical decision making, as well as reviewing the written report by the radiologist.  ED Provider Interpretation: No evidence of DVT on ultrasound.  EXAM: Right LOWER EXTREMITY VENOUS DOPPLER ULTRASOUND  TECHNIQUE: Gray-scale sonography with compression, as well as color and duplex ultrasound, were performed to evaluate the deep venous system(s) from the level of the common femoral vein through the popliteal and proximal calf veins.  COMPARISON: None Available.  FINDINGS: VENOUS  Normal compressibility of the common femoral, superficial femoral, and popliteal veins, as well as the visualized calf veins. Visualized portions of profunda femoral vein and great saphenous vein unremarkable. No filling defects to suggest DVT on grayscale or color Doppler imaging. Doppler waveforms show normal direction of venous flow, normal respiratory plasticity and response to augmentation.  Limited views of the contralateral common femoral vein are unremarkable.  OTHER  None.  Limitations: none  IMPRESSION: No right lower extremity deep venous thrombosis.  Electronically Signed By: Tish Frederickson M.D. On: 02/28/2022 23:48   PROCEDURES:  Critical Care performed: No  Procedures   MEDICATIONS ORDERED IN ED: Medications - No data to display   IMPRESSION / MDM / ASSESSMENT AND PLAN / ED COURSE  I reviewed the triage vital signs and the nursing notes.                              Differential diagnosis includes, but is not limited to, calf strain, cellulitis, DVT  Patient's presentation is most consistent with acute presentation with potential threat to life or bodily function.   Patient's diagnosis is consistent with calf strain.  Patient presented to the emergency department complaining of right calf  pain.  This occurred after playing with his kids.  Patient became concerned as his calf.  More edematous than left.  He presented for evaluation to ensure no evidence of DVT.  Ultrasound was reassuring with no DVT.  Patiently placed on anti-inflammatory for muscle strain.  Follow-up primary care as needed.  Return precautions discussed with the patient..  Patient is given ED precautions to return to the ED for any worsening or new symptoms.        FINAL CLINICAL IMPRESSION(S) / ED DIAGNOSES   Final diagnoses:  Strain of calf muscle, right, initial encounter     Rx / DC Orders   ED Discharge Orders          Ordered    meloxicam (MOBIC) 15 MG tablet  Daily        02/28/22 2359             Note:  This document was prepared using Dragon voice recognition software and may include unintentional dictation errors.   Lanette Hampshire 02/28/22 2359    Willy Eddy, MD 03/01/22 Ivor Reining

## 2022-03-22 ENCOUNTER — Encounter: Payer: Self-pay | Admitting: Internal Medicine

## 2022-03-22 ENCOUNTER — Ambulatory Visit (INDEPENDENT_AMBULATORY_CARE_PROVIDER_SITE_OTHER): Payer: BC Managed Care – PPO | Admitting: Internal Medicine

## 2022-03-22 VITALS — BP 102/64 | HR 60 | Temp 97.5°F | Ht 67.5 in | Wt 181.0 lb

## 2022-03-22 DIAGNOSIS — Z23 Encounter for immunization: Secondary | ICD-10-CM | POA: Diagnosis not present

## 2022-03-22 DIAGNOSIS — Z0000 Encounter for general adult medical examination without abnormal findings: Secondary | ICD-10-CM | POA: Diagnosis not present

## 2022-03-22 DIAGNOSIS — Z1211 Encounter for screening for malignant neoplasm of colon: Secondary | ICD-10-CM | POA: Diagnosis not present

## 2022-03-22 DIAGNOSIS — K219 Gastro-esophageal reflux disease without esophagitis: Secondary | ICD-10-CM

## 2022-03-22 DIAGNOSIS — J301 Allergic rhinitis due to pollen: Secondary | ICD-10-CM

## 2022-03-22 LAB — CBC
HCT: 46.2 % (ref 39.0–52.0)
Hemoglobin: 15.6 g/dL (ref 13.0–17.0)
MCHC: 33.8 g/dL (ref 30.0–36.0)
MCV: 88.1 fl (ref 78.0–100.0)
Platelets: 192 10*3/uL (ref 150.0–400.0)
RBC: 5.24 Mil/uL (ref 4.22–5.81)
RDW: 13.6 % (ref 11.5–15.5)
WBC: 5.8 10*3/uL (ref 4.0–10.5)

## 2022-03-22 LAB — LIPID PANEL
Cholesterol: 195 mg/dL (ref 0–200)
HDL: 70.1 mg/dL (ref >39.00)
LDL Cholesterol: 115 mg/dL — ABNORMAL HIGH (ref 0–99)
NonHDL: 124.93
Total CHOL/HDL Ratio: 3
Triglycerides: 51 mg/dL (ref 0.0–149.0)
VLDL: 10.2 mg/dL (ref 0.0–40.0)

## 2022-03-22 LAB — COMPREHENSIVE METABOLIC PANEL
ALT: 20 U/L (ref 0–53)
AST: 12 U/L (ref 0–37)
Albumin: 4.6 g/dL (ref 3.5–5.2)
Alkaline Phosphatase: 55 U/L (ref 39–117)
BUN: 21 mg/dL (ref 6–23)
CO2: 27 mEq/L (ref 19–32)
Calcium: 9.8 mg/dL (ref 8.4–10.5)
Chloride: 103 mEq/L (ref 96–112)
Creatinine, Ser: 1.18 mg/dL (ref 0.40–1.50)
GFR: 73.47 mL/min (ref >60.00)
Glucose, Bld: 95 mg/dL (ref 70–99)
Potassium: 4.8 mEq/L (ref 3.5–5.1)
Sodium: 137 mEq/L (ref 135–145)
Total Bilirubin: 0.5 mg/dL (ref 0.2–1.2)
Total Protein: 7.4 g/dL (ref 6.0–8.3)

## 2022-03-22 NOTE — Assessment & Plan Note (Signed)
Discussed OTC options

## 2022-03-22 NOTE — Patient Instructions (Addendum)
You can try cetirizine 10mg  and/or fexofenadine 180 daily for the allergies. Try senna-s---- 1-2 tabs daily to keep your bowels more regular.

## 2022-03-22 NOTE — Progress Notes (Signed)
Subjective:    Patient ID: Thomas Villarreal, male    DOB: Aug 12, 1975, 46 y.o.   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     fluocinonide (LIDEX) 0.05 % external solution      hydrocortisone 2.5 % cream      ketoconazole (NIZORAL) 2 % shampoo SHAMPOO TWO TIMES WEEKLY. LEAVE ON 5 MINUTES THEN RINSE WELL.     No current facility-administered medications on file prior to visit.    Allergies  Allergen Reactions   Adhesive [Tape] Rash    Past Medical History:  Diagnosis Date   Allergic rhinitis    Chronic kidney disease    kidney stones   GERD (gastroesophageal reflux disease)     Past Surgical History:  Procedure Laterality Date   CATARACT EXTRACTION W/ INTRAOCULAR LENS IMPLANT Bilateral    CHOLECYSTECTOMY  4/16   INGUINAL HERNIA REPAIR Left 06/2016    Family History  Problem Relation Age of Onset   Hyperlipidemia Father    Healthy Mother    Kidney disease Maternal Grandfather    Colon cancer Neg Hx    Esophageal cancer Neg Hx    Rectal cancer Neg Hx    Stomach cancer Neg Hx     Social History   Socioeconomic History   Marital status: Married    Spouse name: Not on file   Number of children: 1   Years of education: Not on file   Highest education level: Not on file  Occupational History   Occupation: Owner    Comment: [employer]  Tobacco Use    Smoking status: Former    Packs/day: 1.00    Years: 15.00    Total pack years: 15.00    Types: Cigarettes    Quit date: 01/23/2004    Years since quitting: 18.1    Passive exposure: Past   Smokeless tobacco: Never  Substance and Sexual Activity   Alcohol use: Yes    Comment: Occasional   Drug use: No   Sexual activity: Not on file  Other Topics Concern   Not on file  Social History Narrative   Married; 1 daughter         Social Determinants of Health   Financial Resource Strain: Not on file  Food Insecurity: Not on file  Transportation Needs: Not on file  Physical Activity: Not on file  Stress: Not on file  Social Connections: Not on file  Intimate Partner Violence: Not on file   Review of Systems  Constitutional:  Negative for fatigue and unexpected weight change.       Wears seat belt  HENT:  Positive for tinnitus. Negative for dental problem and hearing loss.        Keeps up with dentist  Eyes:        Trouble with right eye--going to another eye doctor (had detached retina)  Respiratory:  Negative for cough, chest tightness and shortness of breath.   Cardiovascular:  Negative for chest pain and leg swelling.       Feels his heart at times--not racing  Gastrointestinal:  Positive for constipation.       Some blood on toilet paper--if has to push hard  Endocrine: Negative for polydipsia and polyuria.  Genitourinary:  Negative for difficulty urinating and urgency.       No sexual problems  Musculoskeletal:  Negative for back pain and joint swelling.  Skin:        Minor rash on elbows---doesn't usually use steroid cream  Allergic/Immunologic: Positive for environmental allergies. Negative for immunocompromised state.  Neurological:  Negative for dizziness, syncope, light-headedness and headaches.  Hematological:  Negative for adenopathy. Does not bruise/bleed easily.  Psychiatric/Behavioral:  Negative for dysphoric mood.        Usually sleeps okay Typical  worries---"overthinks"       Objective:   Physical Exam Constitutional:      Appearance: Normal appearance.  HENT:     Mouth/Throat:     Pharynx: No oropharyngeal exudate or posterior oropharyngeal erythema.  Eyes:     Conjunctiva/sclera: Conjunctivae normal.     Comments: Left pupil larger than right  Cardiovascular:     Rate and Rhythm: Normal rate and regular rhythm.     Pulses: Normal pulses.     Heart sounds: No murmur heard.    No gallop.  Pulmonary:     Effort: Pulmonary effort is normal.     Breath sounds: Normal breath sounds. No wheezing or rales.  Abdominal:     Palpations: Abdomen is soft.     Tenderness: There is no abdominal tenderness.  Musculoskeletal:     Cervical back: Neck supple.     Right lower leg: No edema.     Left lower leg: No edema.  Lymphadenopathy:     Cervical: No cervical adenopathy.  Skin:    Findings: No lesion or rash.  Neurological:     General: No focal deficit present.     Mental Status: He is alert and oriented to person, place, and time.  Psychiatric:        Mood and Affect: Mood normal.        Behavior: Behavior normal.            Assessment & Plan:

## 2022-03-22 NOTE — Addendum Note (Signed)
Addended by: Pilar Grammes on: 03/22/2022 03:46 PM   Modules accepted: Orders

## 2022-03-22 NOTE — Assessment & Plan Note (Signed)
No sig symptoms lately

## 2022-03-22 NOTE — Assessment & Plan Note (Signed)
Healthy Generally stays in shape--recovering from calf injury now Will do FIT--hold off on colonoscopy No PSA yet Td today He will consider flu and COVID vaccines

## 2022-04-05 ENCOUNTER — Other Ambulatory Visit: Payer: Self-pay | Admitting: Radiology

## 2022-04-05 DIAGNOSIS — Z1211 Encounter for screening for malignant neoplasm of colon: Secondary | ICD-10-CM

## 2022-04-06 ENCOUNTER — Telehealth: Payer: Self-pay | Admitting: *Deleted

## 2022-04-06 DIAGNOSIS — R195 Other fecal abnormalities: Secondary | ICD-10-CM

## 2022-04-06 LAB — FECAL OCCULT BLOOD, IMMUNOCHEMICAL: Fecal Occult Bld: POSITIVE — AB

## 2022-04-06 NOTE — Telephone Encounter (Signed)
Noted.  Okay to wait for recommendations from PCP

## 2022-04-06 NOTE — Telephone Encounter (Signed)
Results released. Will work on setting up GI evaluation for colonoscopy

## 2022-04-06 NOTE — Telephone Encounter (Signed)
Received a call from Mickel Baas at the [facility] stating that patient's IFob is positive. Dr. Silvio Pate is out of the office. Dr Diona Browner was verbally informed of the results. Sending phone  note to PCP and Dr. Diona Browner.

## 2022-11-02 LAB — HM COLONOSCOPY

## 2023-02-09 ENCOUNTER — Encounter (INDEPENDENT_AMBULATORY_CARE_PROVIDER_SITE_OTHER): Payer: Self-pay

## 2023-03-27 ENCOUNTER — Encounter: Payer: Self-pay | Admitting: Internal Medicine

## 2023-03-27 ENCOUNTER — Ambulatory Visit (INDEPENDENT_AMBULATORY_CARE_PROVIDER_SITE_OTHER): Payer: BC Managed Care – PPO | Admitting: Internal Medicine

## 2023-03-27 VITALS — BP 116/82 | HR 56 | Temp 97.5°F | Ht 67.5 in | Wt 183.0 lb

## 2023-03-27 DIAGNOSIS — J301 Allergic rhinitis due to pollen: Secondary | ICD-10-CM | POA: Diagnosis not present

## 2023-03-27 DIAGNOSIS — Z0000 Encounter for general adult medical examination without abnormal findings: Secondary | ICD-10-CM

## 2023-03-27 DIAGNOSIS — K219 Gastro-esophageal reflux disease without esophagitis: Secondary | ICD-10-CM

## 2023-03-27 LAB — CBC
HCT: 46.4 % (ref 39.0–52.0)
Hemoglobin: 15.4 g/dL (ref 13.0–17.0)
MCHC: 33.1 g/dL (ref 30.0–36.0)
MCV: 88.7 fL (ref 78.0–100.0)
Platelets: 207 10*3/uL (ref 150.0–400.0)
RBC: 5.22 Mil/uL (ref 4.22–5.81)
RDW: 13.4 % (ref 11.5–15.5)
WBC: 5.2 10*3/uL (ref 4.0–10.5)

## 2023-03-27 LAB — COMPREHENSIVE METABOLIC PANEL
ALT: 20 U/L (ref 0–53)
AST: 14 U/L (ref 0–37)
Albumin: 4.3 g/dL (ref 3.5–5.2)
Alkaline Phosphatase: 46 U/L (ref 39–117)
BUN: 17 mg/dL (ref 6–23)
CO2: 25 meq/L (ref 19–32)
Calcium: 9.4 mg/dL (ref 8.4–10.5)
Chloride: 106 meq/L (ref 96–112)
Creatinine, Ser: 1.08 mg/dL (ref 0.40–1.50)
GFR: 81.13 mL/min (ref >60.00)
Glucose, Bld: 89 mg/dL (ref 70–99)
Potassium: 3.9 meq/L (ref 3.5–5.1)
Sodium: 138 meq/L (ref 135–145)
Total Bilirubin: 0.5 mg/dL (ref 0.2–1.2)
Total Protein: 7.1 g/dL (ref 6.0–8.3)

## 2023-03-27 NOTE — Assessment & Plan Note (Signed)
Cetirizine is effective

## 2023-03-27 NOTE — Assessment & Plan Note (Signed)
Rare symptoms Discussed tums/rolaids prn

## 2023-03-27 NOTE — Assessment & Plan Note (Signed)
Healthy Colon due again 2033 Consider PSA at 54 Not sure about flu/COVID vaccines--recommended

## 2023-03-27 NOTE — Progress Notes (Signed)
Subjective:    Patient ID: Thomas Villarreal, male    DOB: 22-Sep-1975, 47 y.o.   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     ketoconazole (NIZORAL) 2 % shampoo SHAMPOO TWO TIMES WEEKLY. LEAVE ON 5 MINUTES THEN RINSE WELL.     fluocinonide (LIDEX) 0.05 % external solution  (Patient not taking: Reported on 03/27/2023)     hydrocortisone 2.5 % cream  (Patient not taking: Reported on 03/27/2023)     No current facility-administered medications on file prior to visit.    Allergies  Allergen Reactions   Adhesive [Tape] Rash    Past Medical History:  Diagnosis Date   Allergic rhinitis    Chronic kidney disease    kidney stones   GERD (gastroesophageal reflux disease)     Past Surgical History:  Procedure Laterality Date   CATARACT EXTRACTION W/ INTRAOCULAR LENS IMPLANT Bilateral    CHOLECYSTECTOMY  4/16   INGUINAL HERNIA REPAIR Left 06/2016    Family History  Problem Relation Age of Onset   Hyperlipidemia Father    Healthy Mother    Kidney disease Maternal Grandfather    Colon cancer Neg Hx    Esophageal cancer Neg Hx    Rectal cancer Neg Hx    Stomach cancer Neg Hx     Social History   Socioeconomic History   Marital status: Married    Spouse name: Not on file   Number of children: 1   Years of education: Not on file   Highest education level: Not on file  Occupational History   Occupation: Owner    Comment: [employer]  Tobacco Use   Smoking status: Former    Current packs/day: 0.00    Average packs/day: 1 pack/day for 15.0 years (15.0 ttl pk-yrs)    Types: Cigarettes    Start date: 01/22/1989    Quit date: 01/23/2004    Years since quitting: 19.1     Passive exposure: Past   Smokeless tobacco: Never  Substance and Sexual Activity   Alcohol use: Yes    Comment: Occasional   Drug use: No   Sexual activity: Not on file  Other Topics Concern   Not on file  Social History Narrative   Married; 1 daughter         Social Determinants of Health   Financial Resource Strain: Not on file  Food Insecurity: Not on file  Transportation Needs: Not on file  Physical Activity: Not on file  Stress: Not on file  Social Connections: Not on file  Intimate Partner Violence: Not on file   Review of Systems  Constitutional:  Negative for fatigue and unexpected weight change.       Wears seat belt  HENT:  Positive for tinnitus. Negative for dental problem and hearing loss.        Keeps up with dentist  Eyes:  Negative for visual disturbance.       No diplopia or unilateral vision loss  Respiratory:  Negative for cough, chest tightness and shortness of breath.   Cardiovascular:  Negative for chest pain and leg swelling.       Occ feeling in heart---no racing  Gastrointestinal:  Some bowel problems--occ slow Rare heartburn--no meds  Genitourinary:  Negative for difficulty urinating and urgency.       No sexual problems  Musculoskeletal:  Negative for back pain and joint swelling.       Occ right knee pain--stiff in AM. No Rx needed (discussed tylenol or topical diclofenac)  Skin:        Rash on right calf----looks like ringworm (discussed using ketoconazole) Sees derm yearly  Allergic/Immunologic: Positive for environmental allergies. Negative for immunocompromised state.       Does okay with cetirizine  Neurological:  Negative for dizziness, syncope, light-headedness and headaches.  Hematological:  Negative for adenopathy. Does not bruise/bleed easily.  Psychiatric/Behavioral:  Negative for dysphoric mood and sleep disturbance. The patient is not nervous/anxious.        Objective:   Physical Exam Constitutional:       Appearance: Normal appearance.  HENT:     Mouth/Throat:     Pharynx: No oropharyngeal exudate or posterior oropharyngeal erythema.  Eyes:     Conjunctiva/sclera: Conjunctivae normal.     Pupils: Pupils are equal, round, and reactive to light.  Cardiovascular:     Rate and Rhythm: Normal rate and regular rhythm.     Pulses: Normal pulses.     Heart sounds: No murmur heard.    No gallop.  Pulmonary:     Effort: Pulmonary effort is normal.     Breath sounds: Normal breath sounds. No wheezing or rales.  Abdominal:     Palpations: Abdomen is soft.     Tenderness: There is no abdominal tenderness.  Musculoskeletal:     Cervical back: Neck supple.     Right lower leg: No edema.     Left lower leg: No edema.  Lymphadenopathy:     Cervical: No cervical adenopathy.  Skin:    Findings: No lesion.     Comments: Small ringworm on right calf  Neurological:     General: No focal deficit present.     Mental Status: He is alert and oriented to person, place, and time.  Psychiatric:        Mood and Affect: Mood normal.        Behavior: Behavior normal.            Assessment & Plan:

## 2024-04-16 ENCOUNTER — Telehealth: Payer: Self-pay

## 2024-04-16 NOTE — Telephone Encounter (Signed)
 Copied from CRM [id number]. Topic: Appointments - Transfer of Care >> Apr 16, 2024  4:04 PM Rea C wrote: Pt is requesting to transfer FROM: Dr. Jimmy Pt is requesting to transfer TO: NP Vincente Reason for requested transfer: [facility] It is the responsibility of the team the patient would like to transfer to (NP Vincente) to reach out to the patient if for any reason this transfer is not acceptable.

## 2024-04-17 NOTE — Telephone Encounter (Signed)
 noted

## 2024-05-22 ENCOUNTER — Ambulatory Visit: Admitting: General Practice

## 2024-05-22 ENCOUNTER — Encounter: Payer: Self-pay | Admitting: General Practice

## 2024-05-22 ENCOUNTER — Ambulatory Visit: Payer: Self-pay | Admitting: General Practice

## 2024-05-22 VITALS — BP 110/82 | HR 65 | Temp 98.3°F | Ht 67.5 in | Wt 185.0 lb

## 2024-05-22 DIAGNOSIS — E7800 Pure hypercholesterolemia, unspecified: Secondary | ICD-10-CM | POA: Diagnosis not present

## 2024-05-22 DIAGNOSIS — R5383 Other fatigue: Secondary | ICD-10-CM

## 2024-05-22 DIAGNOSIS — Z7689 Persons encountering health services in other specified circumstances: Secondary | ICD-10-CM

## 2024-05-22 DIAGNOSIS — Z0001 Encounter for general adult medical examination with abnormal findings: Secondary | ICD-10-CM | POA: Diagnosis not present

## 2024-05-22 DIAGNOSIS — Z0000 Encounter for general adult medical examination without abnormal findings: Secondary | ICD-10-CM | POA: Insufficient documentation

## 2024-05-22 DIAGNOSIS — K219 Gastro-esophageal reflux disease without esophagitis: Secondary | ICD-10-CM

## 2024-05-22 DIAGNOSIS — E559 Vitamin D deficiency, unspecified: Secondary | ICD-10-CM

## 2024-05-22 LAB — CBC
HCT: 44.2 % (ref 39.0–52.0)
Hemoglobin: 15.2 g/dL (ref 13.0–17.0)
MCHC: 34.4 g/dL (ref 30.0–36.0)
MCV: 85.8 fl (ref 78.0–100.0)
Platelets: 207 K/uL (ref 150.0–400.0)
RBC: 5.15 Mil/uL (ref 4.22–5.81)
RDW: 13.6 % (ref 11.5–15.5)
WBC: 6.3 K/uL (ref 4.0–10.5)

## 2024-05-22 LAB — HEMOGLOBIN A1C: Hgb A1c MFr Bld: 5.3 % (ref 4.6–6.5)

## 2024-05-22 LAB — COMPREHENSIVE METABOLIC PANEL WITH GFR
ALT: 23 U/L (ref 0–53)
AST: 15 U/L (ref 0–37)
Albumin: 4.5 g/dL (ref 3.5–5.2)
Alkaline Phosphatase: 53 U/L (ref 39–117)
BUN: 17 mg/dL (ref 6–23)
CO2: 27 meq/L (ref 19–32)
Calcium: 9.5 mg/dL (ref 8.4–10.5)
Chloride: 104 meq/L (ref 96–112)
Creatinine, Ser: 1.06 mg/dL (ref 0.40–1.50)
GFR: 82.3 mL/min (ref >60.00)
Glucose, Bld: 80 mg/dL (ref 70–99)
Potassium: 4.2 meq/L (ref 3.5–5.1)
Sodium: 138 meq/L (ref 135–145)
Total Bilirubin: 0.7 mg/dL (ref 0.2–1.2)
Total Protein: 7 g/dL (ref 6.0–8.3)

## 2024-05-22 LAB — VITAMIN B12: Vitamin B-12: 272 pg/mL (ref 211–911)

## 2024-05-22 LAB — LIPID PANEL
Cholesterol: 175 mg/dL (ref 0–200)
HDL: 59.3 mg/dL (ref >39.00)
LDL Cholesterol: 102 mg/dL — ABNORMAL HIGH (ref 0–99)
NonHDL: 115.39
Total CHOL/HDL Ratio: 3
Triglycerides: 65 mg/dL (ref 0.0–149.0)
VLDL: 13 mg/dL (ref 0.0–40.0)

## 2024-05-22 LAB — VITAMIN D 25 HYDROXY (VIT D DEFICIENCY, FRACTURES): VITD: 29.97 ng/mL — ABNORMAL LOW (ref 30.00–100.00)

## 2024-05-22 LAB — TSH: TSH: 3.35 u[IU]/mL (ref 0.35–5.50)

## 2024-05-22 MED ORDER — VITAMIN D (ERGOCALCIFEROL) 1.25 MG (50000 UNIT) PO CAPS: 50000.0000 [IU] | ORAL_CAPSULE | ORAL | 0 refills | Status: AC

## 2024-05-22 NOTE — Patient Instructions (Signed)
 Stop by the lab prior to leaving today. I will notify you of your results once received.    Follow up in one year for physical.   It was a pleasure meeting you!

## 2024-05-22 NOTE — Progress Notes (Signed)
 New Patient Office Visit  Subjective    Patient ID: Thomas Villarreal, male    DOB: [birth year]  Age: 48 y.o. MRN: [id number]  CC:  Chief Complaint  Patient presents with   New Patient (Initial Visit)    TOC from Dr. jimmy    HPI Thomas Villarreal is a 48 y.o. male presents to establish care,  for complete physical and follow up of chronic conditions. Previous pcp/physical/labs: Dr. Jimmy; physical and labs was in September, 2024.   Immunizations: -Tetanus: Completed in 2023 -Influenza: due/declines  Diet: Fair diet.  Exercise: No regular exercise.  Eye exam: Completes annually  Dental exam: Completes semi-annually    Colonoscopy: Completed in 2024  Fatigue: reports feeling more tired lately. Unsure if it is age related. No other symptoms. Would like to make sure everything is within normal limits. No abnormal bleeding, chest pain, shortness of breath or difficulty breathing.  Outpatient Encounter Medications as of 05/22/2024  Medication Sig   ketoconazole (NIZORAL) 2 % shampoo SHAMPOO TWO TIMES WEEKLY. LEAVE ON 5 MINUTES THEN RINSE WELL.   [DISCONTINUED] cetirizine (ZYRTEC) 10 MG tablet Take 10 mg by mouth daily.   [DISCONTINUED] fluocinonide (LIDEX) 0.05 % external solution  (Patient not taking: Reported on 03/27/2023)   [DISCONTINUED] hydrocortisone 2.5 % cream  (Patient not taking: Reported on 03/27/2023)   No facility-administered encounter medications on file as of 05/22/2024.    Past Medical History:  Diagnosis Date   Allergic rhinitis    Chronic kidney disease    kidney stones   GERD (gastroesophageal reflux disease)     Past Surgical History:  Procedure Laterality Date   CATARACT EXTRACTION W/ INTRAOCULAR LENS IMPLANT Bilateral    CHOLECYSTECTOMY  4/16   INGUINAL HERNIA REPAIR Left 06/2016    Family History  Problem Relation Age of Onset   Healthy Mother    Hyperlipidemia Father    Dementia Father    Kidney disease Maternal Grandfather    Colon  cancer Neg Hx    Esophageal cancer Neg Hx    Rectal cancer Neg Hx    Stomach cancer Neg Hx     Social History   Socioeconomic History   Marital status: Married    Spouse name: Not on file   Number of children: 1   Years of education: Not on file   Highest education level: Not on file  Occupational History   Occupation: Owner    Comment: [employer]  Tobacco Use   Smoking status: Former    Current packs/day: 0.00    Average packs/day: 1 pack/day for 15.0 years (15.0 ttl pk-yrs)    Types: Cigarettes    Start date: 01/22/1989    Quit date: 01/23/2004    Years since quitting: 20.3    Passive exposure: Past   Smokeless tobacco: Never  Substance and Sexual Activity   Alcohol use: Yes    Comment: Occasional   Drug use: No   Sexual activity: Not on file  Other Topics Concern   Not on file  Social History Narrative   Married; 1 daughter         Social Drivers of Corporate Investment Banker Strain: Not on file  Food Insecurity: Not on file  Transportation Needs: Not on file  Physical Activity: Not on file  Stress: Not on file  Social Connections: Not on file  Intimate Partner Violence: Not on file    Review of Systems  Constitutional:  Negative for chills, fever, malaise/fatigue and weight loss.  HENT:  Negative for congestion, ear discharge, ear pain, hearing loss, nosebleeds, sinus pain, sore throat and tinnitus.   Eyes:  Negative for blurred vision, double vision, pain, discharge and redness.  Respiratory:  Negative for cough, shortness of breath, wheezing and stridor.   Cardiovascular:  Negative for chest pain, palpitations and leg swelling.  Gastrointestinal:  Negative for abdominal pain, constipation, diarrhea, heartburn, nausea and vomiting.  Genitourinary:  Negative for dysuria, frequency and urgency.  Musculoskeletal:  Negative for myalgias.  Skin:  Negative for rash.  Neurological:  Negative for dizziness, tingling, seizures, weakness and headaches.   Psychiatric/Behavioral:  Negative for depression, substance abuse and suicidal ideas. The patient is not nervous/anxious.         Objective    BP 110/82   Pulse 65   Temp 98.3 F (36.8 C) (Temporal)   Ht 5' 7.5 (1.715 m)   Wt 185 lb (83.9 kg)   SpO2 96%   BMI 28.55 kg/m   Physical Exam Vitals and nursing note reviewed.  Constitutional:      Appearance: Normal appearance.  HENT:     Head: Normocephalic and atraumatic.     Right Ear: Tympanic membrane, ear canal and external ear normal.     Left Ear: Tympanic membrane, ear canal and external ear normal.     Nose: Nose normal.     Mouth/Throat:     Mouth: Mucous membranes are moist.     Pharynx: Oropharynx is clear.  Eyes:     Conjunctiva/sclera: Conjunctivae normal.     Pupils: Pupils are equal, round, and reactive to light.  Cardiovascular:     Rate and Rhythm: Normal rate and regular rhythm.     Pulses: Normal pulses.     Heart sounds: Normal heart sounds.  Pulmonary:     Effort: Pulmonary effort is normal.     Breath sounds: Normal breath sounds.  Abdominal:     General: Abdomen is flat. Bowel sounds are normal.     Palpations: Abdomen is soft.  Musculoskeletal:        General: Normal range of motion.     Cervical back: Normal range of motion.  Skin:    General: Skin is warm and dry.     Capillary Refill: Capillary refill takes less than 2 seconds.  Neurological:     General: No focal deficit present.     Mental Status: He is alert and oriented to person, place, and time. Mental status is at baseline.  Psychiatric:        Mood and Affect: Mood normal.        Behavior: Behavior normal.        Thought Content: Thought content normal.        Judgment: Judgment normal.         Assessment & Plan:  Encounter for screening and preventative care Assessment & Plan: Immunizations UTD. Declines influenza vaccine. Colonoscopy UTD, due   Discussed the importance of a healthy diet and regular exercise in  order for weight loss, and to reduce the risk of further co-morbidity.  Exam stable. Labs pending.  Follow up in 1 year for repeat physical.   Orders: -     CBC -     Comprehensive metabolic panel with GFR -     TSH  Establishing care with new doctor, encounter for  Fatigue, unspecified type Assessment & Plan: Unclear etiology.  Will check labs to rule out metabolic cause.  Orders: -  VITAMIN D  25 Hydroxy (Vit-D Deficiency, Fractures) -     Vitamin B12  Elevated LDL cholesterol level Assessment & Plan: Lipid panel pending.  Orders: -     Lipid panel -     Hemoglobin A1c  Gastroesophageal reflux disease without esophagitis Assessment & Plan: Controlled.       Return in about 1 year (around 05/22/2025) for physical and fasting labs.SABRA Carrol [place], NP

## 2024-05-22 NOTE — Assessment & Plan Note (Signed)
 Immunizations UTD. Declines influenza vaccine. Colonoscopy UTD, due   Discussed the importance of a healthy diet and regular exercise in order for weight loss, and to reduce the risk of further co-morbidity.  Exam stable. Labs pending.  Follow up in 1 year for repeat physical.

## 2024-05-22 NOTE — Assessment & Plan Note (Signed)
 Lipid panel pending.

## 2024-05-22 NOTE — Assessment & Plan Note (Signed)
 Controlled.

## 2024-05-22 NOTE — Assessment & Plan Note (Signed)
 Unclear etiology  Will check labs to rule out metabolic cause.

## 2024-08-28 ENCOUNTER — Ambulatory Visit: Admitting: General Practice

## 2025-05-27 ENCOUNTER — Encounter: Admitting: General Practice
